# Patient Record
Sex: Male | Born: 1985 | Race: Black or African American | Hispanic: No | Smoking: Current every day smoker
Health system: Southern US, Community
[De-identification: ages and names within clinical notes are randomized; demographics above are authoritative.]

## PROBLEM LIST (undated history)

## (undated) DIAGNOSIS — F329 Major depressive disorder, single episode, unspecified: Secondary | ICD-10-CM

## (undated) DIAGNOSIS — L309 Dermatitis, unspecified: Secondary | ICD-10-CM

## (undated) DIAGNOSIS — I10 Essential (primary) hypertension: Secondary | ICD-10-CM

## (undated) DIAGNOSIS — F32A Depression, unspecified: Secondary | ICD-10-CM

## (undated) DIAGNOSIS — F319 Bipolar disorder, unspecified: Secondary | ICD-10-CM

---
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
## 2005-11-21 ENCOUNTER — Emergency Department (HOSPITAL_COMMUNITY): Admission: EM | Admit: 2005-11-21 | Discharge: 2005-11-21 | Payer: Self-pay | Admitting: Emergency Medicine

## 2006-02-18 ENCOUNTER — Emergency Department (HOSPITAL_COMMUNITY): Admission: EM | Admit: 2006-02-18 | Discharge: 2006-02-18 | Payer: Self-pay | Admitting: Emergency Medicine

## 2006-04-18 ENCOUNTER — Emergency Department (HOSPITAL_COMMUNITY): Admission: EM | Admit: 2006-04-18 | Discharge: 2006-04-18 | Payer: Self-pay | Admitting: Family Medicine

## 2006-05-28 ENCOUNTER — Emergency Department (HOSPITAL_COMMUNITY): Admission: EM | Admit: 2006-05-28 | Discharge: 2006-05-28 | Payer: Self-pay | Admitting: Emergency Medicine

## 2008-07-23 ENCOUNTER — Emergency Department (HOSPITAL_COMMUNITY): Admission: EM | Admit: 2008-07-23 | Discharge: 2008-07-23 | Payer: Self-pay | Admitting: Emergency Medicine

## 2008-08-15 ENCOUNTER — Emergency Department (HOSPITAL_COMMUNITY): Admission: EM | Admit: 2008-08-15 | Discharge: 2008-08-15 | Payer: Self-pay | Admitting: Emergency Medicine

## 2009-02-02 ENCOUNTER — Emergency Department (HOSPITAL_COMMUNITY): Admission: EM | Admit: 2009-02-02 | Discharge: 2009-02-02 | Payer: Self-pay | Admitting: Emergency Medicine

## 2009-11-06 ENCOUNTER — Emergency Department (HOSPITAL_COMMUNITY): Admission: EM | Admit: 2009-11-06 | Discharge: 2009-11-06 | Payer: Self-pay | Admitting: Emergency Medicine

## 2010-06-02 ENCOUNTER — Emergency Department (HOSPITAL_COMMUNITY): Admission: EM | Admit: 2010-06-02 | Discharge: 2010-06-02 | Payer: Self-pay | Admitting: Emergency Medicine

## 2010-06-15 ENCOUNTER — Emergency Department (HOSPITAL_COMMUNITY): Admission: EM | Admit: 2010-06-15 | Discharge: 2010-06-15 | Payer: Self-pay | Admitting: Emergency Medicine

## 2010-07-02 DEATH — deceased

## 2010-07-12 ENCOUNTER — Emergency Department (HOSPITAL_COMMUNITY): Admission: EM | Admit: 2010-07-12 | Discharge: 2010-07-12 | Payer: Self-pay | Admitting: Emergency Medicine

## 2011-03-03 ENCOUNTER — Emergency Department (HOSPITAL_COMMUNITY)
Admission: EM | Admit: 2011-03-03 | Discharge: 2011-03-03 | Disposition: A | Discharge status: Home / Self Care | Payer: Self-pay | Attending: Emergency Medicine | Admitting: Emergency Medicine

## 2011-03-03 DIAGNOSIS — L259 Unspecified contact dermatitis, unspecified cause: Secondary | ICD-10-CM | POA: Insufficient documentation

## 2011-05-10 ENCOUNTER — Inpatient Hospital Stay (INDEPENDENT_AMBULATORY_CARE_PROVIDER_SITE_OTHER)
Admission: RE | Admit: 2011-05-10 | Discharge: 2011-05-10 | Disposition: A | Discharge status: Home / Self Care | Payer: Self-pay | Source: Ambulatory Visit | Attending: Family Medicine | Admitting: Family Medicine

## 2011-05-10 DIAGNOSIS — L259 Unspecified contact dermatitis, unspecified cause: Secondary | ICD-10-CM

## 2011-06-28 ENCOUNTER — Inpatient Hospital Stay (INDEPENDENT_AMBULATORY_CARE_PROVIDER_SITE_OTHER)
Admission: RE | Admit: 2011-06-28 | Discharge: 2011-06-28 | Disposition: A | Discharge status: Home / Self Care | Payer: Self-pay | Source: Ambulatory Visit | Attending: Family Medicine | Admitting: Family Medicine

## 2011-06-28 DIAGNOSIS — L989 Disorder of the skin and subcutaneous tissue, unspecified: Secondary | ICD-10-CM

## 2011-06-28 DIAGNOSIS — L259 Unspecified contact dermatitis, unspecified cause: Secondary | ICD-10-CM

## 2011-07-13 ENCOUNTER — Inpatient Hospital Stay (INDEPENDENT_AMBULATORY_CARE_PROVIDER_SITE_OTHER)
Admission: RE | Admit: 2011-07-13 | Discharge: 2011-07-13 | Disposition: A | Discharge status: Home / Self Care | Payer: Self-pay | Source: Ambulatory Visit | Attending: Family Medicine | Admitting: Family Medicine

## 2011-07-13 DIAGNOSIS — L259 Unspecified contact dermatitis, unspecified cause: Secondary | ICD-10-CM

## 2011-09-01 ENCOUNTER — Inpatient Hospital Stay (INDEPENDENT_AMBULATORY_CARE_PROVIDER_SITE_OTHER)
Admission: RE | Admit: 2011-09-01 | Discharge: 2011-09-01 | Disposition: A | Discharge status: Home / Self Care | Payer: Self-pay | Source: Ambulatory Visit | Attending: Emergency Medicine | Admitting: Emergency Medicine

## 2011-09-01 DIAGNOSIS — L259 Unspecified contact dermatitis, unspecified cause: Secondary | ICD-10-CM

## 2011-09-26 ENCOUNTER — Emergency Department (HOSPITAL_COMMUNITY)
Admission: EM | Admit: 2011-09-26 | Discharge: 2011-09-26 | Disposition: A | Discharge status: Home / Self Care | Payer: Self-pay | Attending: Emergency Medicine | Admitting: Emergency Medicine

## 2011-09-26 DIAGNOSIS — L259 Unspecified contact dermatitis, unspecified cause: Secondary | ICD-10-CM | POA: Insufficient documentation

## 2011-09-26 DIAGNOSIS — R45851 Suicidal ideations: Secondary | ICD-10-CM | POA: Insufficient documentation

## 2011-09-26 DIAGNOSIS — F329 Major depressive disorder, single episode, unspecified: Secondary | ICD-10-CM | POA: Insufficient documentation

## 2011-09-26 DIAGNOSIS — L299 Pruritus, unspecified: Secondary | ICD-10-CM | POA: Insufficient documentation

## 2011-09-26 DIAGNOSIS — F3289 Other specified depressive episodes: Secondary | ICD-10-CM | POA: Insufficient documentation

## 2011-09-26 DIAGNOSIS — R5381 Other malaise: Secondary | ICD-10-CM | POA: Insufficient documentation

## 2011-09-26 DIAGNOSIS — M2550 Pain in unspecified joint: Secondary | ICD-10-CM | POA: Insufficient documentation

## 2011-09-26 DIAGNOSIS — R5383 Other fatigue: Secondary | ICD-10-CM | POA: Insufficient documentation

## 2011-09-26 DIAGNOSIS — M2540 Effusion, unspecified joint: Secondary | ICD-10-CM | POA: Insufficient documentation

## 2011-09-26 LAB — DIFFERENTIAL
Basophils Absolute: 0.1 10*3/uL (ref 0.0–0.1)
Basophils Relative: 1 % (ref 0–1)
Eosinophils Absolute: 0.9 10*3/uL — ABNORMAL HIGH (ref 0.0–0.7)
Eosinophils Relative: 12 % — ABNORMAL HIGH (ref 0–5)
Lymphocytes Relative: 15 % (ref 12–46)
Lymphs Abs: 1.2 10*3/uL (ref 0.7–4.0)
Monocytes Absolute: 0.7 10*3/uL (ref 0.1–1.0)
Monocytes Relative: 9 % (ref 3–12)
Neutro Abs: 5.1 10*3/uL (ref 1.7–7.7)
Neutrophils Relative %: 63 % (ref 43–77)

## 2011-09-26 LAB — BASIC METABOLIC PANEL
BUN: 12 mg/dL (ref 6–23)
CO2: 27 mEq/L (ref 19–32)
Calcium: 8.7 mg/dL (ref 8.4–10.5)
Chloride: 100 mEq/L (ref 96–112)
Creatinine, Ser: 0.97 mg/dL (ref 0.50–1.35)
GFR calc Af Amer: >90 mL/min (ref >90)
GFR calc non Af Amer: >90 mL/min (ref >90)
Glucose, Bld: 107 mg/dL — ABNORMAL HIGH (ref 70–99)
Potassium: 3.8 mEq/L (ref 3.5–5.1)
Sodium: 137 mEq/L (ref 135–145)

## 2011-09-26 LAB — CBC
HCT: 44.2 % (ref 39.0–52.0)
Hemoglobin: 15.7 g/dL (ref 13.0–17.0)
MCH: 31.8 pg (ref 26.0–34.0)
MCHC: 35.5 g/dL (ref 30.0–36.0)
MCV: 89.7 fL (ref 78.0–100.0)
Platelets: 239 10*3/uL (ref 150–400)
RBC: 4.93 MIL/uL (ref 4.22–5.81)
RDW: 12.7 % (ref 11.5–15.5)
WBC: 8 10*3/uL (ref 4.0–10.5)

## 2011-09-26 LAB — ETHANOL: Alcohol, Ethyl (B): <11 mg/dL (ref 0–11)

## 2011-09-26 LAB — RAPID URINE DRUG SCREEN, HOSP PERFORMED
Amphetamines: NOT DETECTED
Barbiturates: NOT DETECTED
Benzodiazepines: NOT DETECTED
Cocaine: NOT DETECTED
Opiates: NOT DETECTED
Tetrahydrocannabinol: POSITIVE — AB

## 2011-09-30 ENCOUNTER — Encounter: Payer: Self-pay | Admitting: Internal Medicine

## 2011-09-30 NOTE — Progress Notes (Signed)
HPI: 25 y/o man presents with PMH significant for eczema presents to the [facility] ER for rash. His rash was consistent with eczema and he states that he noticed the flare up 2 days prior to his ER visit. He usually takes steroids and anti- histamine when he has these flare ups but he ran out of his medications and was unable to take any. He also reports some pain associated with it. He laso reports joint pains and swelling. Denies any fever, chills, nausea or vomiting.  He had seen a dermatologist in the past but that did not help.he does not have any insurance , so getting to a dermatologist would be an issue at this point.  He works at [employer] and states that he might get some insurance in November . He was requesting if he can be seen by a dermatologist.  Vitals: T- 98.8, BP-136/83, HR-11. RR-18, O2 sats-100% on RA.  Labs: reviewed.  Meds: reviewed.  Disposition: Patient was discharged from the Er with a prescription for steroids and anti- histamines. He was given our clinic number and was advised to call our clinic fro an appointment. I will discuss her case in our clinic. But , I anticipate getting a dermatology referral from the clinic would be difficult.

## 2011-10-30 ENCOUNTER — Inpatient Hospital Stay (HOSPITAL_COMMUNITY)
Admission: EM | Admit: 2011-10-30 | Discharge: 2011-10-31 | DRG: 607 | Disposition: A | Discharge status: Home / Self Care | Payer: Self-pay | Attending: Internal Medicine | Admitting: Internal Medicine

## 2011-10-30 DIAGNOSIS — L309 Dermatitis, unspecified: Secondary | ICD-10-CM

## 2011-10-30 DIAGNOSIS — H60399 Other infective otitis externa, unspecified ear: Secondary | ICD-10-CM | POA: Diagnosis present

## 2011-10-30 DIAGNOSIS — L209 Atopic dermatitis, unspecified: Secondary | ICD-10-CM | POA: Diagnosis present

## 2011-10-30 DIAGNOSIS — L0390 Cellulitis, unspecified: Secondary | ICD-10-CM

## 2011-10-30 DIAGNOSIS — F17200 Nicotine dependence, unspecified, uncomplicated: Secondary | ICD-10-CM | POA: Diagnosis present

## 2011-10-30 DIAGNOSIS — R45851 Suicidal ideations: Secondary | ICD-10-CM

## 2011-10-30 DIAGNOSIS — L2089 Other atopic dermatitis: Principal | ICD-10-CM | POA: Diagnosis present

## 2011-10-30 LAB — CBC
HCT: 39.9 % (ref 39.0–52.0)
Hemoglobin: 13.4 g/dL (ref 13.0–17.0)
Hemoglobin: 13.4 g/dL (ref 13.0–17.0)
MCH: 30.6 pg (ref 26.0–34.0)
MCH: 31.2 pg (ref 26.0–34.0)
MCHC: 33.6 g/dL (ref 30.0–36.0)
MCV: 91.1 fL (ref 78.0–100.0)
MCV: 91.2 fL (ref 78.0–100.0)
Platelets: 292 10*3/uL (ref 150–400)
RBC: 4.38 MIL/uL (ref 4.22–5.81)
RBC: 4.3 MIL/uL (ref 4.22–5.81)
RDW: 12.8 % (ref 11.5–15.5)
WBC: 7.8 10*3/uL (ref 4.0–10.5)

## 2011-10-30 LAB — BASIC METABOLIC PANEL
BUN: 9 mg/dL (ref 6–23)
CO2: 25 mEq/L (ref 19–32)
Calcium: 8.5 mg/dL (ref 8.4–10.5)
Chloride: 106 mEq/L (ref 96–112)
Creatinine, Ser: 0.82 mg/dL (ref 0.50–1.35)
GFR calc Af Amer: >90 mL/min (ref >90)
GFR calc non Af Amer: >90 mL/min (ref >90)
Glucose, Bld: 97 mg/dL (ref 70–99)
Potassium: 3.8 mEq/L (ref 3.5–5.1)
Sodium: 139 mEq/L (ref 135–145)

## 2011-10-30 LAB — CREATININE, SERUM
Creatinine, Ser: 0.91 mg/dL (ref 0.50–1.35)
GFR calc Af Amer: >90 mL/min (ref >90)

## 2011-10-30 LAB — ETHANOL: Alcohol, Ethyl (B): <11 mg/dL (ref 0–11)

## 2011-10-30 MED ORDER — LORAZEPAM 1 MG PO TABS: 1.0000 mg | ORAL_TABLET | Freq: Three times a day (TID) | ORAL | Status: DC | PRN

## 2011-10-30 MED ORDER — SODIUM CHLORIDE 0.9 % IV SOLN: 250.0000 mL | INTRAVENOUS | Status: DC | PRN

## 2011-10-30 MED ORDER — ACETAMINOPHEN 325 MG PO TABS: 650.0000 mg | ORAL_TABLET | ORAL | Status: DC | PRN

## 2011-10-30 MED ORDER — HYDROXYZINE HCL 25 MG PO TABS
25.0000 mg | ORAL_TABLET | Freq: Three times a day (TID) | ORAL | Status: DC | PRN
  Filled 2011-10-30: qty 1

## 2011-10-30 MED ORDER — ONDANSETRON HCL 4 MG PO TABS
4.0000 mg | ORAL_TABLET | Freq: Three times a day (TID) | ORAL | Status: DC | PRN
  Administered 2011-10-30: 4 mg via ORAL
  Filled 2011-10-30: qty 1

## 2011-10-30 MED ORDER — SODIUM CHLORIDE 0.9 % IJ SOLN
3.0000 mL | Freq: Two times a day (BID) | INTRAMUSCULAR | Status: DC
  Administered 2011-10-30: 3 mL via INTRAVENOUS

## 2011-10-30 MED ORDER — PREDNISONE 20 MG PO TABS
60.0000 mg | ORAL_TABLET | Freq: Once | ORAL | Status: AC
  Administered 2011-10-30: 60 mg via ORAL
  Filled 2011-10-30: qty 3

## 2011-10-30 MED ORDER — OXYCODONE-ACETAMINOPHEN 5-325 MG PO TABS
1.0000 | ORAL_TABLET | Freq: Once | ORAL | Status: AC
  Administered 2011-10-30: 1 via ORAL
  Filled 2011-10-30: qty 1

## 2011-10-30 MED ORDER — DOXYCYCLINE HYCLATE 100 MG PO TABS
100.0000 mg | ORAL_TABLET | Freq: Once | ORAL | Status: AC
  Administered 2011-10-30: 100 mg via ORAL
  Filled 2011-10-30: qty 1

## 2011-10-30 MED ORDER — IBUPROFEN 600 MG PO TABS
600.0000 mg | ORAL_TABLET | Freq: Three times a day (TID) | ORAL | Status: DC | PRN
  Administered 2011-10-30: 600 mg via ORAL
  Filled 2011-10-30: qty 3
  Filled 2011-10-30: qty 1

## 2011-10-30 MED ORDER — ACETAMINOPHEN 650 MG RE SUPP
650.0000 mg | Freq: Four times a day (QID) | RECTAL | Status: DC | PRN
  Filled 2011-10-30: qty 1

## 2011-10-30 MED ORDER — DOXEPIN HCL 25 MG PO CAPS
25.0000 mg | ORAL_CAPSULE | Freq: Every day | ORAL | Status: DC
  Administered 2011-10-30: 25 mg via ORAL
  Filled 2011-10-30 (×2): qty 1

## 2011-10-30 MED ORDER — TRIAMCINOLONE ACETONIDE 0.1 % EX OINT
TOPICAL_OINTMENT | Freq: Two times a day (BID) | CUTANEOUS | Status: DC
  Administered 2011-10-30 – 2011-10-31 (×2): via TOPICAL
  Filled 2011-10-30: qty 15

## 2011-10-30 MED ORDER — KETOROLAC TROMETHAMINE 60 MG/2ML IM SOLN
60.0000 mg | Freq: Once | INTRAMUSCULAR | Status: DC
  Filled 2011-10-30: qty 2

## 2011-10-30 MED ORDER — METHYLPREDNISOLONE SODIUM SUCC 125 MG IJ SOLR
80.0000 mg | Freq: Four times a day (QID) | INTRAMUSCULAR | Status: DC
  Administered 2011-10-30 – 2011-10-31 (×3): 80 mg via INTRAVENOUS
  Filled 2011-10-30 (×6): qty 1.28

## 2011-10-30 MED ORDER — TUBERCULIN PPD 5 UNIT/0.1ML ID SOLN
5.0000 [IU] | Freq: Once | INTRADERMAL | Status: DC
  Filled 2011-10-30 (×2): qty 0.1

## 2011-10-30 MED ORDER — HYDROCODONE-ACETAMINOPHEN 5-325 MG PO TABS
1.0000 | ORAL_TABLET | ORAL | Status: DC | PRN
  Filled 2011-10-30: qty 1

## 2011-10-30 MED ORDER — ALUM & MAG HYDROXIDE-SIMETH 200-200-20 MG/5ML PO SUSP: 30.0000 mL | ORAL | Status: DC | PRN

## 2011-10-30 MED ORDER — DOXYCYCLINE HYCLATE 100 MG PO TABS
100.0000 mg | ORAL_TABLET | ORAL | Status: AC
  Administered 2011-10-30: 100 mg via ORAL
  Filled 2011-10-30: qty 1

## 2011-10-30 MED ORDER — ENOXAPARIN SODIUM 40 MG/0.4ML ~~LOC~~ SOLN
40.0000 mg | SUBCUTANEOUS | Status: DC
  Administered 2011-10-30: 40 mg via SUBCUTANEOUS
  Filled 2011-10-30 (×2): qty 0.4

## 2011-10-30 MED ORDER — ZOLPIDEM TARTRATE 5 MG PO TABS: 5.0000 mg | ORAL_TABLET | Freq: Every evening | ORAL | Status: DC | PRN

## 2011-10-30 MED ORDER — NICOTINE 21 MG/24HR TD PT24
21.0000 mg | MEDICATED_PATCH | Freq: Every day | TRANSDERMAL | Status: DC
  Administered 2011-10-31: 21 mg via TRANSDERMAL
  Filled 2011-10-30: qty 1

## 2011-10-30 MED ORDER — PREDNISONE 20 MG PO TABS: 60.0000 mg | ORAL_TABLET | Freq: Once | ORAL | Status: DC

## 2011-10-30 MED ORDER — SODIUM CHLORIDE 0.9 % IJ SOLN
3.0000 mL | INTRAMUSCULAR | Status: DC | PRN
  Administered 2011-10-30: 3 mL via INTRAVENOUS

## 2011-10-30 MED ORDER — DOXYCYCLINE HYCLATE 100 MG PO TABS
100.0000 mg | ORAL_TABLET | Freq: Two times a day (BID) | ORAL | Status: DC
  Administered 2011-10-30 – 2011-10-31 (×2): 100 mg via ORAL
  Filled 2011-10-30 (×3): qty 1

## 2011-10-30 MED ORDER — ACETAMINOPHEN 325 MG PO TABS: 650.0000 mg | ORAL_TABLET | Freq: Four times a day (QID) | ORAL | Status: DC | PRN

## 2011-10-30 NOTE — ED Notes (Signed)
Pt. Developed pain all over his skin

## 2011-10-30 NOTE — ED Notes (Signed)
Dr. Fredricka Bonine at bedside assessing pt.

## 2011-10-30 NOTE — ED Notes (Signed)
Pt here with c/o skin rash all over his body.  Pt reports that he has been suffering from severe eczema since 1996.  Pt currently has no insurance until January and has been unable to follow up with dermatology.  Pts rash is all over his body, ears, bue, back, neck, ble as well.  Pt reports that it does drain and ooze and is very debilitating which lends to his depression and suicidal ideations.  Pt has no plan and reports that his thoughts are daily and have been ongoing for years.

## 2011-10-30 NOTE — ED Provider Notes (Signed)
5:05 PM I was called to the bedside to see the patient at the request of his nurse and the case manager, to evaluate him for his severe eczema that is diffuse and over his entire body and severe, causing him pain, and itching. The patient is not actively suicidal and has been cleared by the behavioral health act team for outpatient management of depression. The case manager was concerned about the patient's ability to followup with dermatology for definitive treatment of his eczema and what appears to be some cellulitis at his right ear, with weeping and oozing indurated skin. No other localized sign of infection is present. The patient is awake and uncomfortable appearing itching himself. The patient is in no apparent respiratory distress and has a normal mental status. He is afebrile. I have called and spoken with the hospitalist to evaluate the patient to see if there is a reasonable way that the patient may be admitted for care of what is likely eczema or psoriasis while social work is working on arranging for a sure it outpatient care. He has agreed to see and evaluate the patient.  Felisa Bonier, MD 10/30/11 [phone]

## 2011-10-30 NOTE — ED Notes (Signed)
Report called to Lordis RN on floor and pt moved.  Sitter released.

## 2011-10-30 NOTE — H&P (Signed)
PATIENT DETAILS Name: Julian Pugh Age: 25 y.o. Sex: male Date of Birth: [birth year] Admit Date: 10/30/2011 PCP:No primary provider on file.   CHIEF COMPLAINT:  Itching and pain  HPI: 25 year old male with a history of atopic dermatitis for the last 15 years presenting to the emergency department with severe itching and pain all over his skin. In addition the patient has noticed oozing of clear yellow liquid from his ears and lower back. The patient states that he has visited urgent care centers and these images and department at least 9 or 10 times this year for the same eczema flareups. He feels tired and frustrated. His appointment with dermatology is 11/06/2011 which is his first appointment for ambulatory care. The patient does not have a primary care physician on a regular allergist or dermatologist. He denies fever, chills or night sweats. He denies recent travel or exposure to sick contacts. He states that every time he comes to the ED he gets a tapering dose of prednisone and he is sent home but once the medication is over his eczema flares up back again   ALLERGIES:  No Known Allergies  PAST MEDICAL HISTORY: Atopic dermatitis for the last 15 years Allergic rhinitis, seasonal Allergic conjunctivitis  PAST SURGICAL HISTORY: History reviewed. No pertinent past surgical history.  MEDICATIONS AT HOME: Prior to Admission medications   Not on File    FAMILY HISTORY: Positive family history of allergic rhinitis and eczema  SOCIAL HISTORY: Works as a Production designer, theatre/television/film for [employer] and he is in direct contact to detergents and cleaning products. Admits to smoking half a pack per day and smoking marijuana occasionally. States he had to quit alcohol in order to take prednisone.  REVIEW OF SYSTEMS:  Constitutional:   No  weight loss, night sweats,  Fevers, chills, fatigue.  HEENT:    No headaches, Difficulty swallowing,Tooth/dental problems,Sore throat,  No sneezing, itching, ear  ache, nasal congestion, post nasal drip,   Cardio-vascular: No chest pain,  Orthopnea, PND, swelling in lower extremities, anasarca,         dizziness, palpitations  GI:  No heartburn, indigestion, abdominal pain, nausea, vomiting, diarrhea, change in       bowel habits, loss of appetite  Resp: No shortness of breath with exertion or at rest.  No excess mucus, no productive cough, No non-productive cough,  No coughing up of blood.No change in color of mucus.No wheezing.No chest wall deformity  Skin:  As in history of present illness  GU:  no dysuria, change in color of urine, no urgency or frequency.  No flank pain.  Musculoskeletal: No joint pain or swelling.  No decreased range of motion.  No back pain.  Psych: No change in mood or affect. No depression or anxiety.  No memory loss.   PHYSICAL EXAM: Blood pressure 118/64, pulse 78, temperature 98.1 F (36.7 C), temperature source Oral, resp. rate 18, height 5\' 10"  (1.778 m), weight 135 lb (61.236 kg), SpO2 98.00%.  General appearance :Awake, alert, not in any distress. Speech Clear. Not toxic Looking HEENT: Atraumatic and Normocephalic, pupils equally reactive to light and accomodation Neck: supple, no JVD. No cervical lymphadenopathy.  Chest:Good air entry bilaterally, no added sounds  CVS: S1 S2 regular, no murmurs.  Abdomen: Bowel sounds present, Non tender and not distended with no gaurding, rigidity or rebound. Extremities: B/L Lower Ext shows no edema, both legs are warm to touch, with  dorsalis pedis pulses palpable. Neurology: Awake alert, and oriented X 3, CN II-XII  intact, Non focal, Deep Tendon Reflex-2+ all over, plantar's downgoing B/L, sensory exam is grossly intact.  Skin: severe thickened skin, increased skin markings (lichenification), and excoriated and fibrotic papules over her trunk, back and upper and lower extremities, scalp and neck, ulcerations over his shins , lichenification of the ears with oozing of  serous drainage, excoriation in the lower back with oozing serous drainage as well   LABS ON ADMISSION:   Basename 10/30/11 1153  NA 139  K 3.8  CL 106  CO2 25  GLUCOSE 97  BUN 9  CREATININE 0.82  CALCIUM 8.5  MG --  PHOS --   No results found for this basename: AST:2,ALT:2,ALKPHOS:2,BILITOT:2,PROT:2,ALBUMIN:2 in the last 72 hours No results found for this basename: LIPASE:2,AMYLASE:2 in the last 72 hours  Basename 10/30/11 1153  WBC 7.8  NEUTROABS --  HGB 13.4  HCT 39.9  MCV 91.1  PLT 292   No results found for this basename: CKTOTAL:3,CKMB:3,CKMBINDEX:3,TROPONINI:3 in the last 72 hours No results found for this basename: DDIMER:2 in the last 72 hours No results found for this basename: POCBNP:3 in the last 72 hours   RADIOLOGIC STUDIES ON ADMISSION: No results found.  ASSESSMENT AND PLAN: Present on Admission:  .Atopic eczema, severe, complicated by secondary infection.  admit to MedSurg unit Start IV Solu-Medrol 80 mg every 6 hours Triamcinolone ointment 0.1% apply twice a day to affected areas excluding face and skin folds Tazorac to face and skin folds Hydroxyzine 25 mg every 6 when necessary for itching Doxepin 25 mg by mouth each bedtime Obtained MRSA screening Doxycycline 100 mg by mouth twice a day Check RPR and place PPD Monitor CBG and contact M.D. if glucose greater than 140 for insulin sliding scale Limit exposure to heat, water and humidity Get plenty of sleep No need for moisturizing creams or lubricants if patient is treated with triamcinolone ointment base  Dermatology consultation for consideration of phototherapy or cyclosporin or methotrexate.     Further plan will depend as patient's clinical course evolves and further radiologic and laboratory data become available. Patient will be monitored closely.   DVT Prophylaxis: Lovenox   Code Status:Full code   Total time spent for admission equals 45 minutes.  Jonny Ruiz 10/30/2011, 6:42 PM

## 2011-10-30 NOTE — Progress Notes (Signed)
Met with patient after receiving text from SW [place] to provide assistance to patient. Patient states he has a hard time describing his discomfort that is at about 7/10. Patient is constantly rubbing and scratching his face, head, arms, ears, pacing around the room then sitting down and covering up with the blanket. Sheets are covered in bloody, serosanguinous fluids.

## 2011-10-30 NOTE — Progress Notes (Signed)
Called Dr Jordan Hawks to verify Julian Pugh order, said Okay to DC sitter.

## 2011-10-30 NOTE — ED Notes (Signed)
Anne from Care management at pts bedside doing assessment.

## 2011-10-30 NOTE — ED Provider Notes (Signed)
History     CSN: [id number] Arrival date & time: 10/30/2011 10:59 AM   First MD Initiated Contact with Patient 10/30/11 1142      Chief Complaint  Patient presents with  . Rash  . Suicidal    (Consider location/radiation/quality/duration/timing/severity/associated sxs/prior treatment) HPI Patient presents with complaint of diffuse generalized rash that he's had more than 10 years. He states he has chronic pain from this rash and that he has difficulty working due to this pain in the rash. He has no systemic symptoms. He has not been able to see a dermatologist do to financial issues. He also states that he feels suicidal. He states that this is a constant problem for him and at night he feels that his problems are overwhelming. He denies any substance use. He denies any history of suicide attempts.  History reviewed. No pertinent past medical history.  History reviewed. No pertinent past surgical history.  History reviewed. No pertinent family history.  History  Substance Use Topics  . Smoking status: Current Everyday Smoker -- 1.0 packs/day    Types: Cigarettes  . Smokeless tobacco: Not on file  . Alcohol Use: Yes      Review of Systems ROS reviewed and otherwise negative except for mentioned in HPI  Allergies  Review of patient's allergies indicates no known allergies.  Home Medications  No current outpatient prescriptions on file.  BP 118/64  Pulse 78  Temp(Src) 98.1 F (36.7 C) (Oral)  Resp 18  Ht 5\' 10"  (1.778 m)  Wt 135 lb (61.236 kg)  BMI 19.37 kg/m2  SpO2 98% Vitals reviewed Physical Exam Physical Examination: General appearance - alert, well appearing, and in no distress Mental status - alert, oriented to person, place, and time, flat affect Chest - clear to auscultation, no wheezes, rales or rhonchi, symmetric air entry Heart - normal rate, regular rhythm, normal S1, S2, no murmurs, rubs, clicks or gallops Musculoskeletal - no joint tenderness,  deformity or swelling Extremities - peripheral pulses normal, no pedal edema, no clubbing or cyanosis Skin - diffuse dry skin over exremities, palms, ears with cracking and excoriation Mouth- MMM, no OP lesions  ED Course  Procedures (including critical care time)   Labs Reviewed  CBC  BASIC METABOLIC PANEL  ETHANOL  URINE RAPID DRUG SCREEN (HOSP PERFORMED)   No results found.   1. Eczema   2. Cellulitis     3:03 PM pt seen by ACT team- she states he is not actively suicidal- has new baby and has no active plan or intentions, but does feel depressed about his current situation.  She has contacted social work who is discussing with case management to see if there is a way to assist him with getting in to see dermatology- he has appointment 12/5  MDM  Patient presenting with concern for his chronic and diffuse eczema causing him pain. He also states that he feels suicidal. Medical clearance workup was begun. He was given Toradol for the discomfort from the skin rash. He will be assessed by the act team do to his depression and suicidality.        Ethelda Chick, MD 10/30/11 [phone]

## 2011-10-30 NOTE — BH Assessment (Signed)
Assessment Note   Julian Pugh is an 25 y.o. male that presented to the ED to address pain/itching a/w his chronic Cirrhosis, Eczema, and hip dysplasia.  Pt is not eligible for insurance until January and has been dealing with his medical problems since a child, worsening as of late.  Pt does admit thoughts of suicide a/w his medical problems, but denies plan or intent "I just want this to go away.  I want help with my conditions."  Pt also believes that treatment with Prednisone also worsened his depressive symptoms.  Pt is interested in receiving a consult from Care Management/SW to address possible f/u with medical providers to address his current conditions.  Pt denies HI, psychosis or any substance abuse concerns.  Pt is able to contract for safety and accepted f/u psychiatric referrals prn.  Writer spoke with Dr. Karma Ganja who is agreeable with the referrals and f/u proposed.    Axis I: Depressive Disorder secondary to general medical condition Axis II: Deferred Axis III: History reviewed. No pertinent past medical history. Axis IV: economic problems, problems with access to health care services and problems with primary support group Axis V: 41-50 serious symptoms  Past Medical History: History reviewed. No pertinent past medical history.  History reviewed. No pertinent past surgical history.  Family History: History reviewed. No pertinent family history.  Social History:  reports that he has been smoking Cigarettes.  He has been smoking about 1 pack per day. He does not have any smokeless tobacco history on file. He reports that he drinks alcohol. He reports that he does not use illicit drugs.  Allergies: No Known Allergies  Home Medications:  Medications Prior to Admission  Medication Dose Route Frequency Provider Last Rate Last Dose  . acetaminophen (TYLENOL) tablet 650 mg  650 mg Oral Q4H PRN Ethelda Chick, MD      . alum & mag hydroxide-simeth (MAALOX/MYLANTA) 200-200-20  MG/5ML suspension 30 mL  30 mL Oral PRN Ethelda Chick, MD      . ibuprofen (ADVIL,MOTRIN) tablet 600 mg  600 mg Oral Q8H PRN Ethelda Chick, MD      . ketorolac (TORADOL) injection 60 mg  60 mg Intramuscular Once Ethelda Chick, MD      . LORazepam (ATIVAN) tablet 1 mg  1 mg Oral Q8H PRN Ethelda Chick, MD      . nicotine (NICODERM CQ - dosed in mg/24 hours) patch 21 mg  21 mg Transdermal Daily Ethelda Chick, MD      . ondansetron [facility]) tablet 4 mg  4 mg Oral Q8H PRN Ethelda Chick, MD      . zolpidem (AMBIEN) tablet 5 mg  5 mg Oral QHS PRN Ethelda Chick, MD       No current outpatient prescriptions on file as of 10/30/2011.    OB/GYN Status:  No LMP for male patient.  General Assessment Data Assessment Number: 1  Living Arrangements: Other (Comment) Can pt return to current living arrangement?: Yes Admission Status: Voluntary Is patient capable of signing voluntary admission?: Yes Transfer from: Acute Hospital Referral Source: MD  Risk to self Suicidal Ideation: Yes-Currently Present Suicidal Intent: No Is patient at risk for suicide?: No Suicidal Plan?: No-Not Currently/Within Last 6 Months Access to Means: Yes Specify Access to Suicidal Means:  (pills and knives available) What has been your use of drugs/alcohol within the last 12 months?: unknown Other Self Harm Risks: none Triggers for Past Attempts: Unknown Intentional  Self Injurious Behavior: None Factors that decrease suicide risk: Sense of responsibility to family Family Suicide History: No Recent stressful life event(s): Recent negative physical changes Persecutory voices/beliefs?: No Depression: Yes Depression Symptoms: Insomnia;Guilt;Loss of interest in usual pleasures;Feeling worthless/self pity;Feeling angry/irritable Substance abuse history and/or treatment for substance abuse?: No Suicide prevention information given to non-admitted patients: Yes  Risk to Others Homicidal Ideation: No Thoughts  of Harm to Others: No Current Homicidal Intent: No Current Homicidal Plan: No Access to Homicidal Means: No Identified Victim: n/a History of harm to others?: No Assessment of Violence: None Noted Violent Behavior Description: none noted Does patient have access to weapons?: Yes (Comment) Criminal Charges Pending?: No Does patient have a court date: No  Mental Status Report Appear/Hygiene: Disheveled Eye Contact: Fair Motor Activity: Restlessness Speech: Soft Level of Consciousness: Quiet/awake Mood: Depressed;Anxious Affect: Anxious;Apathetic;Depressed Anxiety Level: Moderate Thought Processes: Relevant Judgement: Impaired Orientation: Person;Place;Time;Situation Obsessive Compulsive Thoughts/Behaviors: Severe  Cognitive Functioning Concentration: Normal Memory: Recent Intact;Remote Intact IQ: Average Insight: Fair Impulse Control: Fair Appetite: Fair Weight Loss: 0  Weight Gain: 0  Sleep: Decreased Total Hours of Sleep: 4  Vegetative Symptoms: None               Values / Beliefs Cultural Requests During Hospitalization: None Spiritual Requests During Hospitalization: None        Additional Information 1:1 In Past 12 Months?: No CIRT Risk: No Elopement Risk: No Does patient have medical clearance?: Yes     Disposition:  Disposition Disposition of Patient: Referred to Patient referred to: Other (Comment) (referred to Care Management and SW)  On Site Evaluation by:   Reviewed with Physician:     Angelica Ran 10/30/2011 2:42 PM

## 2011-10-31 LAB — RAPID URINE DRUG SCREEN, HOSP PERFORMED
Amphetamines: NOT DETECTED
Barbiturates: NOT DETECTED
Benzodiazepines: NOT DETECTED

## 2011-10-31 LAB — HIV ANTIBODY (ROUTINE TESTING W REFLEX): HIV: NONREACTIVE

## 2011-10-31 LAB — MRSA PCR SCREENING: MRSA by PCR: NEGATIVE

## 2011-10-31 MED ORDER — AVEENO SOOTHING BATH TREATMENT EX PACK
1.0000 | PACK | Freq: Once | CUTANEOUS | Status: AC
  Administered 2011-10-31: 1 via TOPICAL
  Filled 2011-10-31: qty 1

## 2011-10-31 MED ORDER — HYDROXYZINE HCL 25 MG PO TABS: 25.0000 mg | ORAL_TABLET | Freq: Three times a day (TID) | ORAL | Status: AC | PRN

## 2011-10-31 MED ORDER — TRIAMCINOLONE ACETONIDE 0.5 % EX CREA: TOPICAL_CREAM | Freq: Two times a day (BID) | CUTANEOUS | Status: AC

## 2011-10-31 MED ORDER — HYDROCODONE-ACETAMINOPHEN 5-325 MG PO TABS: 1.0000 | ORAL_TABLET | ORAL | Status: AC | PRN

## 2011-10-31 MED ORDER — TRIAMCINOLONE ACETONIDE 0.5 % EX CREA
1.0000 "application " | TOPICAL_CREAM | Freq: Two times a day (BID) | CUTANEOUS | Status: DC
  Filled 2011-10-31: qty 15

## 2011-10-31 MED ORDER — TRIAMCINOLONE ACETONIDE 0.1 % EX OINT: TOPICAL_OINTMENT | Freq: Two times a day (BID) | CUTANEOUS | Status: DC

## 2011-10-31 NOTE — Progress Notes (Signed)
   CARE MANAGEMENT NOTE 10/31/2011  Patient:  Julian Pugh, Julian Pugh   Account Number:  [id number]  Date Initiated:  10/31/2011  Documentation initiated by:  Donn Pierini  Subjective/Objective Assessment:   Pt admitted with severe eczema     Action/Plan:   PTA pt lived at rooming house in [place], was independent with ADLs   Anticipated DC Date:  11/01/2011   Anticipated DC Plan:  HOME/SELF CARE      DC Planning Services  CM consult  Medication Assistance      Choice offered to / List presented to:             Status of service:  Completed, signed off Medicare Important Message given?   (If response is "NO", the following Medicare IM given date fields will be blank) Date Medicare IM given:   Date Additional Medicare IM given:    Discharge Disposition:  HOME/SELF CARE  Per UR Regulation:  Reviewed for med. necessity/level of care/duration of stay  Comments:  10/31/11- 1130- Donn Pierini RN, BSN [phone] Spoke with pt at bedside, per conversation pt states that he actually lives in a rooming house here in [place], uses the [facility] on [address] for any medications needed. CM to follow for any potential d/c needs. Checked with Pharmacy- pt is eligible for assistance with medications if needed at discharge. update- 1645- pt for discharge- to assist with medication- through indigent fund- for ointment

## 2011-10-31 NOTE — Progress Notes (Signed)
Utilization review complete 

## 2011-10-31 NOTE — Consult Note (Signed)
Wound care note:  Severe eczema is beyond WOC scope of practice.  PLEASE CONSULT DERMATOLOGY for assessment and plan of care for this complex problem.   Will not plan to follow further unless re-consulted.  Mardee Postin, RN, MSN, [employer]  [phone]

## 2011-10-31 NOTE — Discharge Summary (Signed)
Physician Discharge Summary  Patient ID: Julian Pugh MRN: [id number] DOB/AGE: Jun 27, 1986 25 y.o. Primary Care Physician:No primary provider on file. Admit date: 10/30/2011 Discharge date: 10/31/2011    Discharge Diagnoses:   Principal Problem:  *Atopic eczema   Current Discharge Medication List    START taking these medications   Details  HYDROcodone-acetaminophen (NORCO) 5-325 MG per tablet Take 1-2 tablets by mouth every 4 (four) hours as needed for pain. Qty: 30 tablet, Refills: 0    hydrOXYzine (ATARAX/VISTARIL) 25 MG tablet Take 1 tablet (25 mg total) by mouth 3 (three) times daily as needed for itching. Qty: 30 tablet, Refills: 0    triamcinolone cream (KENALOG) 0.5 % Apply topically 2 (two) times daily. Qty: 450 g, Refills: 0        Discharged Condition: fair    Consults:none  Significant Diagnostic Studies: No results found.  Lab Results: Basic Metabolic Panel:  Basename 10/30/11 2134 10/30/11 1153  NA -- 139  K -- 3.8  CL -- 106  CO2 -- 25  GLUCOSE -- 97  BUN -- 9  CREATININE 0.91 0.82  CALCIUM -- 8.5  MG -- --  PHOS -- --   Liver Function Tests: No results found for this basename: AST:2,ALT:2,ALKPHOS:2,BILITOT:2,PROT:2,ALBUMIN:2 in the last 72 hours   CBC:  Basename 10/30/11 2134 10/30/11 1153  WBC 9.6 7.8  NEUTROABS -- --  HGB 13.4 13.4  HCT 39.2 39.9  MCV 91.2 91.1  PLT 297 292    Recent Results (from the past 240 hour(s))  MRSA PCR SCREENING     Status: Normal   Collection Time   10/30/11 11:06 PM      Component Value Range Status Comment   MRSA by PCR NEGATIVE  NEGATIVE  Final      Hospital Course:  25 yo man with history of eczema admitted from the ED after he presented with widespread lesions and itching and pain to the point of commiting suicide. He received 2 doses of iv steroids and prn anxiolytics with improvement in his overall state. He remains with plaque like lesions over the back, chest, neck, scalp, palms,  feet. Plan of care was discussed with Dr. Karlyn Agee and includes triamcinolone cream and close F/U in the office.    Discharge Exam: Blood pressure 117/61, pulse 100, temperature 98 F (36.7 C), temperature source Oral, resp. rate 18, height 5\' 10"  (1.778 m), weight 61.236 kg (135 lb), SpO2 97.00%. Patient is alert and oriented x3 Lungs are clear to auscultation bilaterally Heart regular without murmurs rubs or gallops Skin with plaque-like scaly lesions on the elbows, neck, palms,   Disposition: home    Follow-up Information    Make an appointment with Wonda Amis, MD.   Contact information:   [address] [facility] [place] [address] [phone]          Signed: Lonia Blood 10/31/2011, 5:12 PM

## 2013-11-29 ENCOUNTER — Encounter (HOSPITAL_COMMUNITY): Payer: Self-pay | Admitting: Emergency Medicine

## 2013-11-29 ENCOUNTER — Emergency Department (HOSPITAL_COMMUNITY)
Admission: EM | Admit: 2013-11-29 | Discharge: 2013-11-30 | Disposition: A | Discharge status: Home / Self Care | Payer: Self-pay | Attending: Emergency Medicine | Admitting: Emergency Medicine

## 2013-11-29 DIAGNOSIS — K089 Disorder of teeth and supporting structures, unspecified: Secondary | ICD-10-CM | POA: Insufficient documentation

## 2013-11-29 DIAGNOSIS — F17200 Nicotine dependence, unspecified, uncomplicated: Secondary | ICD-10-CM | POA: Insufficient documentation

## 2013-11-29 DIAGNOSIS — K0889 Other specified disorders of teeth and supporting structures: Secondary | ICD-10-CM

## 2013-11-29 DIAGNOSIS — K0381 Cracked tooth: Secondary | ICD-10-CM | POA: Insufficient documentation

## 2013-11-29 DIAGNOSIS — K029 Dental caries, unspecified: Secondary | ICD-10-CM | POA: Insufficient documentation

## 2013-11-29 MED ORDER — ACETAMINOPHEN 325 MG PO TABS
650.0000 mg | ORAL_TABLET | Freq: Once | ORAL | Status: AC
  Administered 2013-11-29: 650 mg via ORAL
  Filled 2013-11-29: qty 2

## 2013-11-29 NOTE — ED Notes (Signed)
Patient presents stating that he has several broken teeth and bad teeth but the one on the top right is really worse.  "It is surrounded by many broken teeth"

## 2013-11-29 NOTE — ED Notes (Signed)
Unable to locate pt. at triage and waiting room .

## 2013-11-29 NOTE — ED Provider Notes (Signed)
CSN: [id number]     Arrival date & time 11/29/13  2027 History   This chart was scribed for non-physician practitioner Raymon Mutton, PA-C, working with Doug Sou, MD, by Yevette Edwards, ED Scribe. This patient was seen in room TR07C/TR07C and the patient's care was started at 12:00 AM.  First MD Initiated Contact with Patient 11/29/13 2232     Chief Complaint  Patient presents with  . Dental Pain    The history is provided by the patient. No language interpreter was used.   HPI Comments: Julian Pugh is a 27 y.o. male who presents to the Emergency Department complaining of gradually-increasing upper, right-sided dental pain which began this morning. He characterizes the pain as "throbbing," and he reports it has affected his ability to eat normally. He rates the pain as 10/10. He has also experienced minimal drainage from the site . The pt reports he has used Orajel, Ibuprofen, and Tylenol without resolution.  The pt denies trouble swallowing, SOB, neck pain, neck stiffness or a fever. In the ED his temperature is 98.5. He has a h/o dental issues including several broken teeth. He reports that he does not use a dentist, but instead pulls his own teeth. He also reports he brushes his teeth twice a day, but when he attempts to floss, his gums bleed. The pt smokes cigarettes and marijuana. He denies heroine and cocaine usage.   History reviewed. No pertinent past medical history. History reviewed. No pertinent past surgical history. History reviewed. No pertinent family history. History  Substance Use Topics  . Smoking status: Current Every Day Smoker -- 1.00 packs/day    Types: Cigarettes  . Smokeless tobacco: Not on file  . Alcohol Use: Yes    Review of Systems  Constitutional: Negative for fever.  HENT: Positive for dental problem. Negative for trouble swallowing.   Respiratory: Negative for shortness of breath.   Musculoskeletal: Negative for neck pain and neck stiffness.   All other systems reviewed and are negative.   Allergies  Review of patient's allergies indicates no known allergies.  Home Medications   Current Outpatient Rx  Name  Route  Sig  Dispense  Refill  . HYDROcodone-acetaminophen (NORCO/VICODIN) 5-325 MG per tablet   Oral   Take 1 tablet by mouth every 6 (six) hours as needed.   9 tablet   0   . penicillin v potassium (VEETID) 500 MG tablet   Oral   Take 1 tablet (500 mg total) by mouth 4 (four) times daily.   40 tablet   0     Triage Vitals: BP 137/93  Pulse 67  Temp(Src) 98.5 F (36.9 C) (Oral)  Resp 20  Ht 5\' 9"  (1.753 m)  Wt 144 lb (65.318 kg)  BMI 21.26 kg/m2  SpO2 99%  Physical Exam  Nursing note and vitals reviewed. Constitutional: He is oriented to person, place, and time. He appears well-developed and well-nourished. No distress.  HENT:  Head: Normocephalic and atraumatic.  Mouth/Throat: Oropharynx is clear and moist. No oropharyngeal exudate.  Negative facial swelling Discomfort upon palpation to the right maxillary and right mandibular region of the jaw line. Poor dentition noted with numerous teeth in decaying process and diagrammed. Negative periapical abscesses identified. Uvula midline, symmetrical elevation. Negative trismus. Negative sublingual lesions. Negative active drainage or bleeding noted. Negative findings of abscess.   Eyes: EOM are normal.  Neck: Normal range of motion. Neck supple.  Negative neck stiffness Negative nuchal rigidity Negative cervical lymphadenopathy Negative pain  upon palpation to C-spine  Cardiovascular: Normal rate, regular rhythm and normal heart sounds.   Pulses:      Radial pulses are 2+ on the right side, and 2+ on the left side.  Pulmonary/Chest: Effort normal and breath sounds normal. No respiratory distress. He has no wheezes. He has no rales.  Musculoskeletal: Normal range of motion.  Lymphadenopathy:    He has no cervical adenopathy.  Neurological: He is alert  and oriented to person, place, and time. No cranial nerve deficit. He exhibits normal muscle tone. Coordination normal.  Cranial nerves III through XII grossly intact  Skin: Skin is warm and dry.  Psychiatric: He has a normal mood and affect. His behavior is normal.    ED Course  Procedures (including critical care time)  DIAGNOSTIC STUDIES: Oxygen Saturation is 99% on room air, normal by my interpretation.    COORDINATION OF CARE:  12:06 AM- Discussed treatment plan with patient, which includes pain medication, an antibiotic,  and dental referrals,  and the patient agreed to the plan.   Labs Review Labs Reviewed - No data to display Imaging Review No results found.  EKG Interpretation   None       MDM   1. Pain, dental    Medications  acetaminophen (TYLENOL) tablet 650 mg (650 mg Oral Given 11/29/13 2137)  oxyCODONE-acetaminophen (PERCOCET/ROXICET) 5-325 MG per tablet 1 tablet (1 tablet Oral Given 11/30/13 0026)  penicillin v potassium (VEETID) tablet 500 mg (500 mg Oral Given 11/30/13 0026)   Filed Vitals:   11/29/13 2029  BP: 137/93  Pulse: 67  Temp: 98.5 F (36.9 C)  TempSrc: Oral  Resp: 20  Height: 5\' 9"  (1.753 m)  Weight: 144 lb (65.318 kg)  SpO2: 99%   I personally performed the services described in this documentation, which was scribed in my presence. The recorded information has been reviewed and is accurate.  Patient presenting to the ED with dental pain that started this morning described as an aching, throbbing sensation localized to the right upper jawline with radiation to the right side of the face. Reported that the last time he was seen at a dentist was a long time ago. Patient reported that he has been using Ibuprofen, orajel, and other over the counter medications with minimal relief.  Alert and oriented. GCS 15. Heart rate and rhythm normal. Lungs clear to auscultation bilaterally. Pulses palpable and strong, radial 2+. Negative facial swelling  identified. Poor dentition identified-numerous teeth in decaying process, black in color, numerous teeth diagrammed. Negative swelling, active bleeding or drainage noted to the gumline. Discomfort upon palpation to right maxillary jawline. Negative signs of periapical abscess. Negative trismus. Negative uvula swelling. Uvula midline, symmetrical elevation. Negative sublingual lesions. Doubt Ludwig's angina. Doubt peritonsillar abscess. Dental pain secondary to poor dentition and poor dental hygiene. Pain medications and beginnings of antibiotics given in ED setting. Patient stable, afebrile. Discharged patient. Referred patient to dentist. Discussed with patient that the only way for relief of pain to occur is with extraction of teeth. Discharge patient with antibiotics and pain medication-discussed course, precautions, disposal technique. Dental referral given in discharge paperwork. Discussed with patient to closely monitor symptoms and if symptoms are to worsen or change to report back to the ED - strict return instructions given.  Patient agreed to plan of care, understood, all questions answered.     Raymon Mutton, PA-C 11/30/13 1451

## 2013-11-30 MED ORDER — PENICILLIN V POTASSIUM 250 MG PO TABS
500.0000 mg | ORAL_TABLET | Freq: Once | ORAL | Status: AC
  Administered 2013-11-30: 500 mg via ORAL
  Filled 2013-11-30: qty 2

## 2013-11-30 MED ORDER — PENICILLIN V POTASSIUM 500 MG PO TABS: 500.0000 mg | ORAL_TABLET | Freq: Four times a day (QID) | ORAL | Status: AC

## 2013-11-30 MED ORDER — OXYCODONE-ACETAMINOPHEN 5-325 MG PO TABS
1.0000 | ORAL_TABLET | Freq: Once | ORAL | Status: AC
  Administered 2013-11-30: 1 via ORAL
  Filled 2013-11-30: qty 1

## 2013-11-30 MED ORDER — HYDROCODONE-ACETAMINOPHEN 5-325 MG PO TABS: 1.0000 | ORAL_TABLET | Freq: Four times a day (QID) | ORAL | Status: DC | PRN

## 2013-12-01 NOTE — ED Provider Notes (Signed)
Medical screening examination/treatment/procedure(s) were performed by non-physician practitioner and as supervising physician I was immediately available for consultation/collaboration.  EKG Interpretation   None        Doug Sou, MD 12/01/13 1052

## 2014-12-01 ENCOUNTER — Emergency Department (HOSPITAL_COMMUNITY)
Admission: EM | Admit: 2014-12-01 | Discharge: 2014-12-01 | Disposition: A | Discharge status: Home / Self Care | Payer: BC Managed Care – PPO | Attending: Emergency Medicine | Admitting: Emergency Medicine

## 2014-12-01 ENCOUNTER — Encounter (HOSPITAL_COMMUNITY): Payer: Self-pay | Admitting: Emergency Medicine

## 2014-12-01 DIAGNOSIS — Z79899 Other long term (current) drug therapy: Secondary | ICD-10-CM | POA: Insufficient documentation

## 2014-12-01 DIAGNOSIS — N419 Inflammatory disease of prostate, unspecified: Secondary | ICD-10-CM

## 2014-12-01 DIAGNOSIS — Z720 Tobacco use: Secondary | ICD-10-CM | POA: Insufficient documentation

## 2014-12-01 DIAGNOSIS — N410 Acute prostatitis: Secondary | ICD-10-CM | POA: Insufficient documentation

## 2014-12-01 LAB — URINALYSIS, ROUTINE W REFLEX MICROSCOPIC
BILIRUBIN URINE: NEGATIVE
Glucose, UA: NEGATIVE mg/dL
HGB URINE DIPSTICK: NEGATIVE
Ketones, ur: NEGATIVE mg/dL
Leukocytes, UA: NEGATIVE
Nitrite: NEGATIVE
PH: 7.5 (ref 5.0–8.0)
Protein, ur: NEGATIVE mg/dL
SPECIFIC GRAVITY, URINE: 1.016 (ref 1.005–1.030)
UROBILINOGEN UA: 1 mg/dL (ref 0.0–1.0)

## 2014-12-01 MED ORDER — CIPROFLOXACIN HCL 500 MG PO TABS: 500.0000 mg | ORAL_TABLET | Freq: Two times a day (BID) | ORAL | Status: DC

## 2014-12-01 NOTE — ED Provider Notes (Signed)
CSN: [id number]     Arrival date & time 12/01/14  1538 History   First MD Initiated Contact with Patient 12/01/14 1740     Chief Complaint  Patient presents with  . Testicle Pain     (Consider location/radiation/quality/duration/timing/severity/associated sxs/prior Treatment) Patient is a 28 y.o. male presenting with testicular pain. The history is provided by the patient.  Testicle Pain This is a chronic problem. Episode onset: Intermittiant pain for over one year, sometimes left testicle, sometimes right testcile. Current pain started one week ago. The problem occurs intermittently. The problem has been waxing and waning. Pertinent negatives include no abdominal pain, change in bowel habit, chest pain, diaphoresis, fever, headaches, myalgias, nausea, numbness, rash, sore throat, vomiting or weakness. Associated symptoms comments: Dysuria. He has tried nothing for the symptoms.    History reviewed. No pertinent past medical history. History reviewed. No pertinent past surgical history. History reviewed. No pertinent family history. History  Substance Use Topics  . Smoking status: Current Every Day Smoker -- 1.00 packs/day    Types: Cigarettes  . Smokeless tobacco: Not on file  . Alcohol Use: Yes    Review of Systems  Constitutional: Negative for fever, diaphoresis, activity change and appetite change.  HENT: Negative for facial swelling, sore throat, tinnitus, trouble swallowing and voice change.   Eyes: Negative for pain, redness and visual disturbance.  Respiratory: Negative for chest tightness, shortness of breath and wheezing.   Cardiovascular: Negative for chest pain, palpitations and leg swelling.  Gastrointestinal: Negative for nausea, vomiting, abdominal pain, diarrhea, constipation, blood in stool, abdominal distention, anal bleeding, rectal pain and change in bowel habit.  Endocrine: Negative.   Genitourinary: Positive for dysuria and testicular pain. Negative for  decreased urine volume and scrotal swelling.  Musculoskeletal: Negative for myalgias, back pain and gait problem.  Skin: Negative.  Negative for rash.  Neurological: Negative.  Negative for dizziness, tremors, weakness, numbness and headaches.  Psychiatric/Behavioral: Negative for suicidal ideas, hallucinations and self-injury. The patient is not nervous/anxious.       Allergies  Review of patient's allergies indicates no known allergies.  Home Medications   Prior to Admission medications   Medication Sig Start Date End Date Taking? Authorizing Provider  triamcinolone cream (KENALOG) 0.1 % Apply 1 application topically daily as needed (dry skin and eczema).  08/24/14  Yes Historical Provider, MD  ciprofloxacin (CIPRO) 500 MG tablet Take 1 tablet (500 mg total) by mouth every 12 (twelve) hours. 12/01/14   Lula OlszewskiMike Phu Record, MD  HYDROcodone-acetaminophen (NORCO/VICODIN) 5-325 MG per tablet Take 1 tablet by mouth every 6 (six) hours as needed. Patient not taking: Reported on 12/01/2014 11/30/13   [name], PA-C   BP 109/66 mmHg  Pulse 85  Temp(Src) 98.1 F (36.7 C) (Oral)  Resp 18  Ht 5\' 9"  (1.753 m)  Wt 140 lb (63.504 kg)  BMI 20.67 kg/m2  SpO2 100% Physical Exam  Constitutional: He is oriented to person, place, and time. He appears well-developed and well-nourished. No distress.  HENT:  Head: Normocephalic and atraumatic.  Right Ear: External ear normal.  Left Ear: External ear normal.  Nose: Nose normal.  Mouth/Throat: Oropharynx is clear and moist.  Eyes: Conjunctivae and EOM are normal. Pupils are equal, round, and reactive to light. No scleral icterus.  Neck: Normal range of motion. Neck supple. No JVD present. No tracheal deviation present. No thyromegaly present.  Cardiovascular: Normal rate and intact distal pulses.  Exam reveals no gallop and no friction rub.   No  murmur heard. Pulmonary/Chest: Effort normal and breath sounds normal. No stridor. No respiratory  distress. He has no wheezes. He has no rales.  Abdominal: Soft. He exhibits no distension. There is no tenderness. There is no rebound and no guarding. Hernia confirmed negative in the right inguinal area and confirmed negative in the left inguinal area.  Genitourinary: Rectum normal, testes normal and penis normal. Rectal exam shows no external hemorrhoid and no internal hemorrhoid. Prostate is tender. Cremasteric reflex is present. Right testis shows no mass, no swelling and no tenderness. Left testis shows no mass, no swelling and no tenderness. No phimosis, hypospadias or penile tenderness.  Musculoskeletal: Normal range of motion. He exhibits no edema or tenderness.  Clubbing of fingers bilaterally  Lymphadenopathy:       Right: No inguinal adenopathy present.       Left: No inguinal adenopathy present.  Neurological: He is alert and oriented to person, place, and time. No cranial nerve deficit. He exhibits normal muscle tone. Coordination normal.  Skin: Skin is warm and dry. No rash noted. He is not diaphoretic.  Psychiatric: He has a normal mood and affect. His behavior is normal.  Nursing note and vitals reviewed.   ED Course  Procedures (including critical care time) Labs Review Labs Reviewed  GC/CHLAMYDIA PROBE AMP  URINALYSIS, ROUTINE W REFLEX MICROSCOPIC    Imaging Review No results found.   EKG Interpretation None      MDM   Final diagnoses:  Prostatitis, unspecified prostatitis type    The patient is a 28 y.o. M who presents for over 1 year of intermittent testicle pain. Pain alternates between left and right testicle. Associated symptoms of intermittent dysuria of over 1 year. No rectal pain and patient having normal BMs. No fevers. Testicular exam completely benign as above but rectal exam shows mildly tender prostate. Since patient states symptoms started when he had been celebrate for over 1 year will treat with non-std prostatitis treatment but GC Chlamydia  tests sent to be followed up as an outpatient. Patient also encouraged to see PCP about clubbing of fingers which patient states is not new in nature. Patient discharged with PCP resource guide, rx for ciprofloxacin, and standard ED return precautions.  Patient seen with attending, Dr. Donnald GarrePfeiffer, who oversaw clinical decision making.     Lula OlszewskiMike Keijuan Schellhase, MD 12/01/14 1924  Arby BarretteMarcy Pfeiffer, MD 12/04/14 [phone]

## 2014-12-01 NOTE — ED Notes (Signed)
Pt c/o pain off and on in testicles x's 1 yr.  Also pain with urination off and  On  X's 1 yr.

## 2014-12-01 NOTE — Discharge Instructions (Signed)
°Emergency Department Resource Guide °1) Find a Doctor and Pay Out of Pocket °Although you won't have to find out who is covered by your insurance plan, it is a good idea to ask around and get recommendations. You will then need to call the office and see if the doctor you have chosen will accept you as a new patient and what types of options they offer for patients who are self-pay. Some doctors offer discounts or will set up payment plans for their patients who do not have insurance, but you will need to ask so you aren't surprised when you get to your appointment. ° °2) Contact Your Local Health Department °Not all health departments have doctors that can see patients for sick visits, but many do, so it is worth a call to see if yours does. If you don't know where your local health department is, you can check in your phone book. The CDC also has a tool to help you locate your state's health department, and many state websites also have listings of all of their local health departments. ° °3) Find a Walk-in Clinic °If your illness is not likely to be very severe or complicated, you may want to try a walk in clinic. These are popping up all over the country in pharmacies, drugstores, and shopping centers. They're usually staffed by nurse practitioners or physician assistants that have been trained to treat common illnesses and complaints. They're usually fairly quick and inexpensive. However, if you have serious medical issues or chronic medical problems, these are probably not your best option. ° °No Primary Care Doctor: °- Call [facility] at  832-8000 - they can help you locate a primary care doctor that  accepts your insurance, provides certain services, etc. °- Physician Referral Service- 1-800-533-3463 ° °Chronic Pain Problems: °Organization         Address  Phone   Notes  °[facility]  (336) 297-2271 Patients need to be referred by their primary care doctor.  ° °Medication  Assistance: °Organization         Address  Phone   Notes  °[place] [facility] [address] °[place], [place] [address] (336) 641-8030 --Must be a resident of [place] °-- Must have NO insurance coverage whatsoever (no Medicaid/ Medicare, etc.) °-- The pt. MUST have a primary care doctor that directs their care regularly and follows them in the community °  °[facility]  (866) 331-1348   °[employer]  (888) 892-1162   ° °Agencies that provide inexpensive medical care: °Organization         Address  Phone   Notes  °[facility]  (336) 832-8035   °[facility]    (336) 832-7272   °[facility] [address] °[place], [place] [address] (336) 832-4777   °[facility] [address], °[place] (336) 271-4999   °[facility]    (336) 373-0678   °[facility]    (336) 272-1050   °[facility] ° [address], [place] Phone:  (336) 832-4444, Fax:  (336) 832-4440 Hours of Operation:  9 am - 6 pm, M-F.  Also accepts Medicaid/Medicare and self-pay.  °[facility] ° [address], [place] Phone: (336) 832-3150, Fax: (336) 832-3151. Hours of Operation:  8:30 am - 5:30 pm, M-F.  Also accepts Medicaid and self-pay.  °[facility] [place] [address], [place] Phone: (336) 878-6027   °[facility] [address], [place], [place] (336)723-1848, Ext. 123 Mondays & Thursdays: 7-9 AM.  First 15 patients are seen on a first come, first serve basis. °  ° °Medicaid-accepting [place] Providers: ° °Organization         Address  Phone   Notes  °[facility] [address], [place] (336) 641-2100 Also accepts self-pay patients.  °[facility] [address], [place] ° (336) 856-9996   °[facility] [address], [place]  (336) 288-8857   °[facility] [address] [place] [address], [place] (336) 299-7000   °Veita Bland [address], [place]  ° (336) 373-1557 Only accepts [place] Access Medicaid patients after they have their name applied to their card.  ° °Self-Pay (no insurance) in [place]: ° °Organization         Address  Phone   Notes  °Sickle Cell Patients, [facility] [address], [place] (336) 832-1970   °[facility] [address], [place] (336) 832-4400   °[facility] ° [address], [place] (336) 992-4800   °[facility]/Dr. Osei-Bonsu ° [address] [place] [address], [place] or [address], [place] (336) 841-8500 Phone number for both [place] and [place] locations is the same.  °[facility] [address], [place] (336) 299-0000   °[facility] [address] [place] [address], [place] or [address] (336) 852-7530 °(336) 878-2260   °[facility] [address], [place] (336) 350-1642, phone; (336) 294-5005, fax Sees patients 1st and 3rd Saturday of every month.  Must not qualify for public or private insurance (i.e. Medicaid, Medicare, [place] Health Choice, Veterans' Benefits) • Household income should be no more than 200% of the poverty level •The clinic cannot treat you if you are pregnant or think you are pregnant • Sexually transmitted diseases are not treated at the clinic.  ° ° °Dental Care: °Organization         Address  Phone  Notes  °[place] [facility] [address], [place] (336) 641-6152 Accepts children up to age 21 who are enrolled in Medicaid or [place] Health Choice; pregnant women with a Medicaid card; and children who have applied for Medicaid or [place] Health Choice, but were declined, whose parents can pay a reduced fee at time of service.  °[place]  [facility] [place]  [address], [place] (336) 641-7733 Accepts children up to age 21 who are enrolled in Medicaid or [place] Health Choice; pregnant women with a Medicaid card; and children who have applied for Medicaid or [place] Health Choice, but were declined, whose parents can pay a reduced fee at time of service.  °[facility] ° [address], [place] (336) 641-4533 Patients are seen by appointment only. Walk-ins are not accepted. [facility] will see patients 18 years of age and older. °Monday - Tuesday (8am-5pm) °Most Wednesdays (8:30-5pm) °$30 per visit, cash only  °[facility] ° [address], [place] (336) 641-4533 Patients are seen by appointment only. Walk-ins are not accepted. [facility] will see patients 18 years of age and older. °One   Wednesday Evening (Monthly: Volunteer Based).  $30 per visit, cash only  °[facility]  (919) 537-3737 for adults; Children under age 4, call Graduate Pediatric Dentistry at ([phone]) [phone]. Children aged 4-14, please call ([phone]) [phone] to request a pediatric application. ° Dental services are provided in all areas of dental care including fillings, crowns and bridges, complete and partial dentures, implants, gum treatment, root canals, and extractions. Preventive care is also provided. Treatment is provided to both adults and children. °Patients are selected via a lottery and there is often a waiting list. °  °[facility] [address], °[place] ° (336) 763-8833 [email or web address] °  °[facility] [address], [place], [place] (336)723-1848, Ext. 123 Second and Fourth Thursday of each month, opens at 6:30 AM; Clinic ends at 9 AM.  Patients are seen on a first-come first-served basis, and a limited number are seen during each clinic.  ° °[facility] ° [address], [place], [place] (336) 723-7904    Eligibility Requirements °You must have lived in [place], [place], or [place] counties for at least the last three months. °  You cannot be eligible for state or federal sponsored healthcare insurance, including Veterans Administration, Medicaid, or Medicare. °  You generally cannot be eligible for healthcare insurance through your employer.  °  How to apply: °Eligibility screenings are held every Tuesday and Wednesday afternoon from 1:00 pm until 4:00 pm. You do not need an appointment for the interview!  °[facility] [address], [place], [place] 336-631-2330   °[facility]  336-342-8273   °[facility]  336-703-3100   °[facility]  336-570-6415   ° °Behavioral Health Resources in the Community: °Intensive Outpatient Programs °Organization         Address  Phone  Notes  °[facility] [address], [place], [place] 336-878-6098   °[facility] [address], [place], [place] 336-832-9800   °[facility] [address], [place], [place] ° 336-882-2125   °[facility] [address],  °[place], [place] 1-800-853-5163 or 336-641-4981   °Substance Abuse Resources °Organization         Address  Phone  Notes  °[facility]  336-882-2125   °[facility]  336-784-9470   °[facility]  336-285-9073   °[facility]  336-845-3988   °Residential & Outpatient Substance Abuse Program  1-800-659-3381   °Psychological Services °Organization         Address  Phone  Notes  °[facility]  336- 832-9600   °[facility]  336- 378-7881   °[facility] [address], [place] 1-800-853-5163 or 336-641-4981   ° °Mobile Crisis Teams °Organization         Address  Phone  Notes  °[facility], Mobile Crisis Care Unit  1-877-626-1772   °Assertive °[facility] ° [address]  [place], [place] 336-834-9664   °Sharon DeEsch [address] °[place] 336-554-5454   ° °Self-Help/Support Groups °Organization         Address  Phone             Notes  °[facility]  - variety of support groups  336- 373-1402 Call for more information  °Narcotics Anonymous (NA), [facility] [address], °[place]  2 meetings at this location  ° °  Residential Treatment Programs Organization         Address  Phone  Notes  [facility] [address],    [place]  [phone]   [facility]  [address], [place] [phone], [place], [place] [phone]   [facility] [address], [place] [phone]) [phone] Admissions: 8am-3pm M-F  [facility] [address]    [place], [place] 295-284-1324([phone]   [facility] [address], [place], [place] [phone]   [facility] [address]  [place], [place] [phone]   [facility] Intensive Outpatient [address], [place], [place] [phone]   [facility] ([facility] [address]  [place], [place] [phone]-(308)[phone] or [phone]   [facility] ([facility] [address] [place], [place] 323-557-3220([phone] Accepts Medicaid  [facility] [address]  [place] [phone] Substance Abuse/Addiction Treatment   [facility]         Address  Phone  Notes  [facility]  (941)738-1304(888) (743) [phone]   Angie FavaJulie Brannon, PhD [address], [address] [place], [place]   ([phone](336) [phone] or ([phone](336) [phone]   [facility]   [address] [place], [place] [phone](336) [phone]   [facility] [address], [place], [place] [phone](336) ([phone] Insurance/Medicaid/sponsorship through [facility] [address]                                    [place], [place] ([phone]) [phone](336) [phone] Therapy/tele-psych/case    [facility] [address]   [place], [place] [phone](336) [phone]    Dr. Lolly MustacheArfeen  [phone]) [phone]   [facility]  [employer] [facility] 1) [address], [place] 2) [address], [place] 3)  [address], [place] [phone](336) [phone]) [phone]  [phone](336) [phone]   [facility] Child Abuse Hotline 3323665395(336) [phone] or (402)[phone](336) ([phone] (After Hours)       Prostatitis The prostate gland is about the size and shape of a walnut. It is located just below your bladder. It produces one of the components of semen, which is made up of sperm and the fluids that help nourish and transport it out from the testicles. Prostatitis is inflammation of the prostate gland.  There are four types of prostatitis:  Acute bacterial prostatitis. This is the least common type of prostatitis. It starts quickly and usually is associated with a bladder infection, high fever, and shaking chills. It can occur at any age.  Chronic bacterial prostatitis. This is a persistent bacterial infection in the prostate. It usually develops from repeated acute bacterial prostatitis or acute bacterial prostatitis that was not properly treated. It can occur in men of any age but is most common in middle-aged men whose prostate has begun to enlarge. The symptoms are not as severe as those in acute bacterial prostatitis. Discomfort in the part of your body that is in front of your rectum and below your scrotum (perineum), lower abdomen, or in the head of your penis (glans) may represent your primary discomfort.  Chronic prostatitis (nonbacterial). This is the most common type of prostatitis. It is inflammation of the prostate gland that is not caused by a bacterial infection. The cause is unknown and may be associated with a viral infection  or autoimmune disorder.  Prostatodynia (pelvic floor disorder). This is associated with increased muscular tone in the pelvis surrounding the  prostate. CAUSES The causes of bacterial prostatitis are bacterial infection. The causes of the other types of prostatitis are unknown.  SYMPTOMS  Symptoms can vary depending upon the type of prostatitis that exists. There can also be overlap in symptoms. Possible symptoms for each type of prostatitis are listed below. Acute Bacterial Prostatitis  Painful urination.  Fever or chills.  Muscle or joint pains.  Low back pain.  Low abdominal pain.  Inability to empty bladder completely. Chronic Bacterial Prostatitis, Chronic Nonbacterial Prostatitis, and Prostatodynia  Sudden urge to urinate.  Frequent urination.  Difficulty starting urine stream.  Weak urine stream.  Discharge from the urethra.  Dribbling after urination.  Rectal pain.  Pain in the testicles, penis, or tip of the penis.  Pain in the perineum.  Problems with sexual function.  Painful ejaculation.  Bloody semen. DIAGNOSIS  In order to diagnose prostatitis, your health care provider will ask about your symptoms. One or more urine samples will be taken and tested (urinalysis). If the urinalysis result is negative for bacteria, your health care provider may use a finger to feel your prostate (digital rectal exam). This exam helps your health care provider determine if your prostate is swollen and tender. It will also produce a specimen of semen that can be analyzed. TREATMENT  Treatment for prostatitis depends on the cause. If a bacterial infection is the cause, it can be treated with antibiotic medicine. In cases of chronic bacterial prostatitis, the use of antibiotics for up to 1 month or 6 weeks may be necessary. Your health care provider may instruct you to take sitz baths to help relieve pain. A sitz bath is a bath of hot water in which your hips and buttocks are under water. This relaxes the pelvic floor muscles and often helps to relieve the pressure on your prostate. HOME CARE INSTRUCTIONS   Take all  medicines as directed by your health care provider.  Take sitz baths as directed by your health care provider. SEEK MEDICAL CARE IF:   Your symptoms get worse, not better.  You have a fever. SEEK IMMEDIATE MEDICAL CARE IF:   You have chills.  You feel nauseous or vomit.  You feel lightheaded or faint.  You are unable to urinate.  You have blood or blood clots in your urine. MAKE SURE YOU:  Understand these instructions.  Will watch your condition.  Will get help right away if you are not doing well or get worse. Document Released: 11/15/2000 Document Revised: 11/23/2013 Document Reviewed: 06/07/2013 [facility] Patient Information 2015 [employer] [place]. This information is not intended to replace advice given to you by your health care provider. Make sure you discuss any questions you have with your health care provider.

## 2014-12-05 LAB — GC/CHLAMYDIA PROBE AMP
CT Probe RNA: NEGATIVE
GC PROBE AMP APTIMA: NEGATIVE

## 2015-12-14 ENCOUNTER — Encounter (HOSPITAL_COMMUNITY): Payer: Self-pay

## 2015-12-14 ENCOUNTER — Emergency Department (HOSPITAL_COMMUNITY)
Admission: EM | Admit: 2015-12-14 | Discharge: 2015-12-14 | Disposition: A | Discharge status: Home / Self Care | Payer: Self-pay | Attending: Physician Assistant | Admitting: Physician Assistant

## 2015-12-14 DIAGNOSIS — Z8659 Personal history of other mental and behavioral disorders: Secondary | ICD-10-CM | POA: Insufficient documentation

## 2015-12-14 DIAGNOSIS — M5416 Radiculopathy, lumbar region: Secondary | ICD-10-CM | POA: Insufficient documentation

## 2015-12-14 DIAGNOSIS — M25559 Pain in unspecified hip: Secondary | ICD-10-CM | POA: Insufficient documentation

## 2015-12-14 DIAGNOSIS — F17210 Nicotine dependence, cigarettes, uncomplicated: Secondary | ICD-10-CM | POA: Insufficient documentation

## 2015-12-14 DIAGNOSIS — Z792 Long term (current) use of antibiotics: Secondary | ICD-10-CM | POA: Insufficient documentation

## 2015-12-14 DIAGNOSIS — I10 Essential (primary) hypertension: Secondary | ICD-10-CM | POA: Insufficient documentation

## 2015-12-14 HISTORY — DX: Depression, unspecified: F32.A

## 2015-12-14 HISTORY — DX: Major depressive disorder, single episode, unspecified: F32.9

## 2015-12-14 HISTORY — DX: Essential (primary) hypertension: I10

## 2015-12-14 HISTORY — DX: Bipolar disorder, unspecified: F31.9

## 2015-12-14 MED ORDER — METHOCARBAMOL 500 MG PO TABS: 1000.0000 mg | ORAL_TABLET | Freq: Four times a day (QID) | ORAL | Status: DC | PRN

## 2015-12-14 MED ORDER — KETOROLAC TROMETHAMINE 60 MG/2ML IM SOLN
30.0000 mg | Freq: Once | INTRAMUSCULAR | Status: AC
  Administered 2015-12-14: 30 mg via INTRAMUSCULAR
  Filled 2015-12-14: qty 2

## 2015-12-14 NOTE — Discharge Instructions (Signed)
Do not hesitate to return to the emergency room for any new, worsening or concerning symptoms.  Please obtain primary care using resource guide below. Let them know that you were seen in the emergency room and that they will need to obtain records for further outpatient management.  For pain control you may take up to 800mg  of Motrin (also known as ibuprofen). That is usually 4 over the counter pills,  3 times a day. Take with food to minimize stomach irritation   For breakthrough pain you may take Robaxin. Do not drink alcohol, drive or operate heavy machinery when taking Robaxin.     Emergency Department Resource Guide 1) Find a Doctor and Pay Out of Pocket Although you won't have to find out who is covered by your insurance plan, it is a good idea to ask around and get recommendations. You will then need to call the office and see if the doctor you have chosen will accept you as a new patient and what types of options they offer for patients who are self-pay. Some doctors offer discounts or will set up payment plans for their patients who do not have insurance, but you will need to ask so you aren't surprised when you get to your appointment.  2) Contact Your Local Health Department Not all health departments have doctors that can see patients for sick visits, but many do, so it is worth a call to see if yours does. If you don't know where your local health department is, you can check in your phone book. The CDC also has a tool to help you locate your state's health department, and many state websites also have listings of all of their local health departments.  3) Find a Walk-in Clinic If your illness is not likely to be very severe or complicated, you may want to try a walk in clinic. These are popping up all over the country in pharmacies, drugstores, and shopping centers. They're usually staffed by nurse practitioners or physician assistants that have been trained to treat common illnesses  and complaints. They're usually fairly quick and inexpensive. However, if you have serious medical issues or chronic medical problems, these are probably not your best option.  No Primary Care Doctor: - Call Health Connect at  [phone] - they can help you locate a primary care doctor that  accepts your insurance, provides certain services, etc. - Physician Referral Service- [phone]  Chronic Pain Problems: Organization         Address  Phone   Notes  [facility]  [phone] Patients need to be referred by their primary care doctor.   Medication Assistance: Organization         Address  Phone   Notes  [facility] Medication [facility] [address] [place], [place] [address] [phone] --Must be a resident of [place] -- Must have NO insurance coverage whatsoever (no Medicaid/ Medicare, etc.) -- The pt. MUST have a primary care doctor that directs their care regularly and follows them in the community   MedAssist  [phone]   [employer]  [phone]    Agencies that provide inexpensive medical care: Organization         Address  Phone   Notes  [facility]  [phone]   [facility]    [phone]   [facility] [address] [place], [place] [address] [phone]  [facility] [address] [place] [phone]   Planned Parenthood    [phone]   [facility]    [phone]   [place] and [place] [address], [place] Phone:  [phone], Fax:  [phone] Hours of Operation:  9 am - 6 pm, M-F.  Also accepts Medicaid/Medicare and self-pay.  [facility] for [place] [address], [address], [place] Phone: [phone], Fax: [phone]. Hours of Operation:  8:30 am - 5:30 pm, M-F.  Also accepts Medicaid and self-pay.  [facility] [place] [address] [place] Phone: [phone]   [place], [place], [place] [phone], Ext. [phone] Mondays & Thursdays: 7-9 AM.  First 15 patients are seen on a first come, first serve basis.    [place] Providers:  Organization         Address  Phone   Notes  [facility] [address], [place] [phone] Also accepts self-pay patients.  [facility] [address], [place]  [phone]   [place], [address], [place] [phone]   [facility] [address], [place] [phone]   Lucianne Lei [address], [place]   [phone] Only accepts [place] Access [place] patients after they have their name applied to their card.   Self-Pay (no insurance) in [facility] [place]:  Organization         Address  Phone   Notes  Sickle Cell Patients, [facility] [place] [phone]   [facility] [place] [phone]   [facility] [place]  [place], [place], [place] [phone]   [facility]/Dr. Osei-Bonsu  [address] [place] or [address], [place] [phone] Phone number for both [place] and [place] locations is the same.  Urgent Medical and [facility] [address] [place] [phone]   [facility] [address] [place] or [address] [phone]   [facility] [address], [place] [phone], phone; [phone], fax Sees patients 1st and 3rd Saturday of every month.  Must not qualify for public or private insurance (i.e. Medicaid, Medicare, [place] Health Choice, Veterans' Benefits)  Household income should be no more than 200% of the poverty level  The clinic cannot treat you if you are pregnant or think you are pregnant  Sexually transmitted diseases are not treated at the clinic.    Dental Care: Organization         Address  Phone  Notes  [facility] [place] [phone] Accepts children up to age 97 who are enrolled in [place] or [place]; pregnant women with a Medicaid card; and children who have applied for Medicaid or [place] Health Choice, but were declined, whose parents can pay a reduced fee at time of service.  [facility]  [address], [place] [phone] Accepts children up to age 11 who are enrolled in [place] or [place]; pregnant women with a Medicaid card; and  children who have applied for Medicaid or [place] Health Choice, but were declined, whose parents can pay a reduced fee at time of service.  [place] Adult [facility] Access PROGRAM  [place] [phone] Patients are seen by appointment only. Walk-ins are not accepted. [place] will see patients 65 years of age and older. Monday - Tuesday (8am-5pm) Most Wednesdays (8:30-5pm) $30 per visit, cash only  [place] [place] Adult Dental Access PROGRAM  [address], [facility] [phone] Patients are seen by appointment only. Walk-ins are not accepted. [place] will see patients 17 years of age and older. One Wednesday Evening (Monthly: Volunteer Based).  $30 per visit, cash only  [place]  [phone] for adults; Children under age 53, call Graduate Pediatric Dentistry at [phone]. Children aged 31-14, please call [phone] to request a pediatric application.  Dental services are provided in all areas of dental care including fillings, crowns and bridges, complete and partial dentures, implants, gum treatment, root canals, and extractions. Preventive care is  also provided. Treatment is provided to both adults and children. Patients are selected via a lottery and there is often a waiting list.   [facility] [address] [place]  [phone] [email or web address]   [facility] [address] [place], [place] [phone], Ext. [phone] Second and Fourth Thursday of each month, opens at 6:30 AM; Clinic ends at 9 AM.  Patients are seen on a first-come first-served basis, and a limited number are seen during each clinic.   [facility]  [address] [place]ard Danker [place], [place] [phone]   Eligibility Requirements You must have lived in [place], [place], or [place] counties for at least the last three months.   You cannot be eligible for state or federal sponsored [employer], including [employer], [place], or [employer].   You generally cannot be eligible for healthcare insurance through your employer.    How to apply: Eligibility screenings are held every Tuesday and Wednesday afternoon from 1:00 pm until 4:00 pm. You do not need an appointment for the interview!  [facility] [address] [place], [place]   [place]  [place] Department  [place]  [phone]    Behavioral Health Resources in the Community: Intensive Outpatient Programs Organization         Address  Phone  Notes  [place]. [address] [place], [place] [phone]   [facility] [address], [place], [place]   ADS: Alcohol & Drug Svcs [address] [place], [place]   [place] [address],  [place], [place] or [phone]   Substance Abuse Resources Organization         Address  Phone  Notes  Alcohol and Drug Services  [phone]   [place]  [phone]   [facility] [place]   Chinita Pester  [phone]   Residential & Outpatient Substance Abuse Program  [phone]   Psychological Services Organization         Address  Phone  Notes  [facility] [place]  [place]  [phone]   [place] [address], [place] or [phone]    Mobile Crisis Teams Organization  Address  Phone  Notes  [facility], Mobile Crisis Care Unit  [phone]   [facility]ive Psychotherapeutic Services  [address] [place], [place]   [facility] [address], [place] [phone]    Self-Help/Support Groups Organization         Address  Phone             Notes  [place]. of [place] - variety of support groups  [place] Call for more information  Narcotics Anonymous (NA), [facility] [address], [employer] [place]  2 meetings at this location   Special educational needs teacher         Address  Phone  Notes  [facility]idential Treatment [place],    [place]  [phone]   [facility]  [address] [place] [phone], [place], [place]   [place], [place] [phone] Admissions: 8am-3pm M-F  Incentives Substance [place] [address]    [place], [place] [phone]   [facility] [address] [place], [place], [place]   [facility] [address],  [place], [place]   [facility] Programs - Intensive Outpatient [address], [place], [place]   [facility] [place].,  [place], [place] [phone] or [phone]   [facility] (RTS) [address] [place], [place] Accepts Medicaid  [facility] [place] [address]  [place] [phone]  Substance Abuse/Addiction Treatment   [facility] Organization         Address  Phone  Notes  [facility]  [phone]   Domenic Schwab, PhD [address] Arlis Port[place], [place]   [phone] or [phone]   [place], [place] [phone]   [facility] [address] [place], [place] [phone] Insurance/Medicaid/sponsorship through [facility] and Families [address], [address] [place]                                    [place], [place] [phone] [place] [address], [place] [phone]    Dr. Adele Schilder  [phone]   [facility] Dept. 1) [address] [place] 2) [place] 3)  [place] [address], [place] [phone]  [phone]   [place] [phone] or [phone] (After Hours)

## 2015-12-14 NOTE — ED Notes (Addendum)
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 Pt also states he'll have neck pain ever so often.

## 2015-12-14 NOTE — ED Notes (Signed)
Pt stable, ambulatory, states understanding of discharge instructions 

## 2015-12-14 NOTE — ED Provider Notes (Signed)
CSN: [id number]     Arrival date & time 12/14/15  1954 History  By signing my name below, I, Lyndel Safe, attest that this documentation has been prepared under the direction and in the presence of [employer], PA-C. Electronically Signed: Lyndel Safe, ED Scribe. 12/14/2015. 9:41 PM.   Chief Complaint  Patient presents with  . Back Pain  . Hip Pain   The history is provided by the patient. No language interpreter was used.   HPI Comments: Julian Pugh is a 30 y.o. 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 He reports he takes tylenol and ibuprofen chronically for this pain without significant relief. Pt denies fevers, a h/o of cancer, a h/o IV drug abuse, bowel or bladder incontinence, numbness, tingling or weakness. Pt is not followed by a PCP.   Past Medical History  Diagnosis Date  . Hypertension   . Bipolar 1 disorder (HCC)   . Depression    History reviewed. No pertinent past surgical history. No family history on file. Social History  Substance Use Topics  . Smoking status: Current Every Day Smoker -- 1.00 packs/day    Types: Cigarettes  . Smokeless tobacco: None  . Alcohol Use: Yes    Review of Systems A complete 10 system review of systems was obtained and is otherwise negative except at noted in the HPI and PMH.  Allergies  Review of patient's allergies indicates no known allergies.  Home Medications   Prior to Admission medications   Medication Sig Start Date End Date Taking? Authorizing Provider  ciprofloxacin (CIPRO) 500 MG tablet Take 1 tablet (500 mg total) by mouth every 12 (twelve) hours. 12/01/14   [name], MD  HYDROcodone-acetaminophen (NORCO/VICODIN) 5-325 MG per tablet Take 1 tablet by mouth every 6 (six) hours as needed. Patient not taking: Reported on 12/01/2014 11/30/13    [name], PA-C  methocarbamol (ROBAXIN) 500 MG tablet Take 2 tablets (1,000 mg total) by mouth 4 (four) times daily as needed (Pain). 12/14/15   Shekera Beavers, PA-C  triamcinolone cream (KENALOG) 0.1 % Apply 1 application topically daily as needed (dry skin and eczema).  08/24/14   Historical Provider, MD   BP 135/75 mmHg  Pulse 93  Temp(Src) 97.9 F (36.6 C) (Oral)  Resp 20  Ht   Wt 145 lb 7 oz (65.97 kg)  SpO2 97% Physical Exam  Constitutional: He is oriented to person, place, and time. He appears well-developed and well-nourished. No distress.  HENT:  Head: Normocephalic.  Eyes: Conjunctivae are normal.  Neck: Normal range of motion. Neck supple.  Cardiovascular: Normal rate.   Pulmonary/Chest: Effort normal. No respiratory distress.  Musculoskeletal: Normal range of motion.  No point tenderness to percussion of lumbar spinal processes.  No TTP or paraspinal muscular spasm. Strength is 5 out of 5 to bilateral lower extremities at hip and knee; extensor hallucis longus 5 out of 5. Ankle strength 5 out of 5, no clonus, neurovascularly intact. No saddle anaesthesia. Patellar reflexes are 2+ bilaterally.   Ambulated with a slow but coordinated gait  Positive straight leg raise on the left at 40 degrees. Negative straight leg raise on the right.   Neurological: He is alert and oriented to person, place, and time. Coordination normal.  Skin: Skin is warm.  Psychiatric: He has a normal mood and affect. His behavior is  normal.  Nursing note and vitals reviewed.   ED Course  Procedures  DIAGNOSTIC STUDIES: Oxygen Saturation is 97% on RA, normal by my interpretation.    COORDINATION OF CARE: 9:39 PM Imaging not indicated at this time. Will order Toradol IM for pain management in the ED. Discussed treatment plan which includes to prescribe a short course of a muscle relaxer with pt. Advised pt to continue taking ibuprofen 800mg  TID. Will give referral to neurosurgery and PCP  resources guide. Pt acknowledges and agrees to plan.    MDM   Final diagnoses:  Lumbar radiculopathy, chronic    Filed Vitals:   12/14/15 2004 12/14/15 2147  BP: 135/75 128/77  Pulse: 93 69  Temp: 97.9 F (36.6 C)   TempSrc: Oral   Resp: 20 16  Weight: 65.97 kg   SpO2: 97% 100%    Medications  ketorolac (TORADOL) injection 30 mg (30 mg Intramuscular Given 12/14/15 2154)    Julian Pugh is 30 y.o. male presenting with low back pain radiating down left leg, he's been having this intermittently for several months.   No neurological deficits and normal neuro exam.  Patient can walk but states is painful.  No loss of bowel or bladder control.  No concern for cauda equina.  No fever, night sweats, weight loss, h/o cancer, IVDU.  RICE protocol and pain medicine indicated and discussed with patient.  Evaluation does not show pathology that would require ongoing emergent intervention or inpatient treatment. Pt is hemodynamically stable and mentating appropriately. Discussed findings and plan with patient/guardian, who agrees with care plan. All questions answered. Return precautions discussed and outpatient follow up given.   Discharge Medication List as of 12/14/2015  9:45 PM    START taking these medications   Details  methocarbamol (ROBAXIN) 500 MG tablet Take 2 tablets (1,000 mg total) by mouth 4 (four) times daily as needed (Pain)., Starting 12/14/2015, Until Discontinued, Print         I personally performed the services described in this documentation, which was scribed in my presence. The recorded information has been reviewed and is accurate.   Wynetta Emeryicole Jarelyn Bambach, PA-C 12/15/15 0055  Courteney Randall AnLyn Mackuen, MD 12/19/15 [id number]

## 2016-06-21 ENCOUNTER — Emergency Department (HOSPITAL_COMMUNITY)
Admission: EM | Admit: 2016-06-21 | Discharge: 2016-06-21 | Disposition: A | Discharge status: Home / Self Care | Payer: Self-pay | Attending: Emergency Medicine | Admitting: Emergency Medicine

## 2016-06-21 ENCOUNTER — Emergency Department (HOSPITAL_COMMUNITY): Payer: Self-pay

## 2016-06-21 ENCOUNTER — Encounter (HOSPITAL_COMMUNITY): Payer: Self-pay

## 2016-06-21 DIAGNOSIS — Z79899 Other long term (current) drug therapy: Secondary | ICD-10-CM | POA: Insufficient documentation

## 2016-06-21 DIAGNOSIS — M545 Low back pain, unspecified: Secondary | ICD-10-CM

## 2016-06-21 DIAGNOSIS — F17210 Nicotine dependence, cigarettes, uncomplicated: Secondary | ICD-10-CM | POA: Insufficient documentation

## 2016-06-21 DIAGNOSIS — I10 Essential (primary) hypertension: Secondary | ICD-10-CM | POA: Insufficient documentation

## 2016-06-21 DIAGNOSIS — G8929 Other chronic pain: Secondary | ICD-10-CM | POA: Insufficient documentation

## 2016-06-21 DIAGNOSIS — F1290 Cannabis use, unspecified, uncomplicated: Secondary | ICD-10-CM | POA: Insufficient documentation

## 2016-06-21 DIAGNOSIS — Z792 Long term (current) use of antibiotics: Secondary | ICD-10-CM | POA: Insufficient documentation

## 2016-06-21 DIAGNOSIS — F319 Bipolar disorder, unspecified: Secondary | ICD-10-CM | POA: Insufficient documentation

## 2016-06-21 MED ORDER — ACETAMINOPHEN 500 MG PO TABS
1000.0000 mg | ORAL_TABLET | Freq: Once | ORAL | Status: AC
Start: 2016-06-21 — End: 2016-06-21
  Administered 2016-06-21: 1000 mg via ORAL
  Filled 2016-06-21: qty 2

## 2016-06-21 MED ORDER — KETOROLAC TROMETHAMINE 60 MG/2ML IM SOLN
60.0000 mg | Freq: Once | INTRAMUSCULAR | Status: AC
  Administered 2016-06-21: 60 mg via INTRAMUSCULAR
  Filled 2016-06-21: qty 2

## 2016-06-21 MED ORDER — KETOROLAC TROMETHAMINE 30 MG/ML IJ SOLN: 30.0000 mg | Freq: Once | INTRAMUSCULAR | Status: DC

## 2016-06-21 MED ORDER — OXYCODONE HCL 5 MG PO TABS
5.0000 mg | ORAL_TABLET | Freq: Once | ORAL | Status: AC
  Administered 2016-06-21: 5 mg via ORAL
  Filled 2016-06-21: qty 1

## 2016-06-21 NOTE — ED Notes (Signed)
Bed: WA15 Expected date:  Expected time:  Means of arrival:  Comments: EMS  

## 2016-06-21 NOTE — ED Notes (Signed)
Per EMS- Pt arriving with [employer] and [employer] c/o lower back pain radiating down his left leg after struggle with police when being arrested. No incontinence or numbness noted. Hx of herniated disc.
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
## 2016-06-21 NOTE — Discharge Instructions (Signed)
Take 4 over the counter ibuprofen tablets 3 times a day or 2 over-the-counter naproxen tablets twice a day for pain. °Also take tylenol 1000mg(2 extra strength) four times a day.  ° °  ° °Back Pain, Adult °Back pain is very common in adults. The cause of back pain is rarely dangerous and the pain often gets better over time. The cause of your back pain may not be known. Some common causes of back pain include: °· Strain of the muscles or ligaments supporting the spine. °· Wear and tear (degeneration) of the spinal disks. °· Arthritis. °· Direct injury to the back. °For many people, back pain may return. Since back pain is rarely dangerous, most people can learn to manage this condition on their own. °HOME CARE INSTRUCTIONS °Watch your back pain for any changes. The following actions may help to lessen any discomfort you are feeling: °· Remain active. It is stressful on your back to sit or stand in one place for long periods of time. Do not sit, drive, or stand in one place for more than 30 minutes at a time. Take short walks on even surfaces as soon as you are able. Try to increase the length of time you walk each day. °· Exercise regularly as directed by your health care provider. Exercise helps your back heal faster. It also helps avoid future injury by keeping your muscles strong and flexible. °· Do not stay in bed. Resting more than 1-2 days can delay your recovery. °· Pay attention to your body when you bend and lift. The most comfortable positions are those that put less stress on your recovering back. Always use proper lifting techniques, including: °¨ Bending your knees. °¨ Keeping the load close to your body. °¨ Avoiding twisting. °· Find a comfortable position to sleep. Use a firm mattress and lie on your side with your knees slightly bent. If you lie on your back, put a pillow under your knees. °· Avoid feeling anxious or stressed. Stress increases muscle tension and can worsen back pain. It is important  to recognize when you are anxious or stressed and learn ways to manage it, such as with exercise. °· Take medicines only as directed by your health care provider. Over-the-counter medicines to reduce pain and inflammation are often the most helpful. Your health care provider may prescribe muscle relaxant drugs. These medicines help dull your pain so you can more quickly return to your normal activities and healthy exercise. °· Apply ice to the injured area: °¨ Put ice in a plastic bag. °¨ Place a towel between your skin and the bag. °¨ Leave the ice on for 20 minutes, 2-3 times a day for the first 2-3 days. After that, ice and heat may be alternated to reduce pain and spasms. °· Maintain a healthy weight. Excess weight puts extra stress on your back and makes it difficult to maintain good posture. °SEEK MEDICAL CARE IF: °· You have pain that is not relieved with rest or medicine. °· You have increasing pain going down into the legs or buttocks. °· You have pain that does not improve in one week. °· You have night pain. °· You lose weight. °· You have a fever or chills. °SEEK IMMEDIATE MEDICAL CARE IF:  °· You develop new bowel or bladder control problems. °· You have unusual weakness or numbness in your arms or legs. °· You develop nausea or vomiting. °· You develop abdominal pain. °· You feel faint. °  °This information is not intended to replace advice given to   by your health care provider. Make sure you discuss any questions you have with your health care provider.   Document Released: 11/18/2005 Document Revised: 12/09/2014 Document Reviewed: 03/22/2014 Elsevier Interactive Patient Education [employer]

## 2016-06-21 NOTE — ED Notes (Signed)
Pt. Getting into gown giving permission for this NT to cut his tank top off. Pt. Requesting to take tank top with him.

## 2016-10-14 NOTE — ED Notes (Signed)
At patient's request, chart accessed to verify arrival and departure times. (11/13 @ 1318)

## 2016-11-12 ENCOUNTER — Ambulatory Visit (INDEPENDENT_AMBULATORY_CARE_PROVIDER_SITE_OTHER): Payer: Self-pay | Admitting: Family Medicine

## 2016-11-12 VITALS — BP 122/72 | HR 68 | Temp 98.6°F | Resp 18 | Ht 67.5 in | Wt 144.0 lb

## 2016-11-12 DIAGNOSIS — F329 Major depressive disorder, single episode, unspecified: Secondary | ICD-10-CM

## 2016-11-12 DIAGNOSIS — M5442 Lumbago with sciatica, left side: Secondary | ICD-10-CM

## 2016-11-12 DIAGNOSIS — Z024 Encounter for examination for driving license: Secondary | ICD-10-CM

## 2016-11-12 DIAGNOSIS — R3121 Asymptomatic microscopic hematuria: Secondary | ICD-10-CM

## 2016-11-12 NOTE — Progress Notes (Signed)
Subjective:  By signing my name below, I, Julian Pugh, attest that this documentation has been prepared under the direction and in the presence of Julian StaggersJeffrey Caitlan Chauca, MD. Electronically Signed: Stann Oresung-Kai Pugh, Scribe. 11/12/2016 , 4:46 PM .  Patient was seen in Room 9 .   Patient ID: Julian Pugh, male    DOB: 06/11/1986, 30 y.o.   MRN: [id number] Chief Complaint  Patient presents with  . Employment Physical    DOT    HPI Julian Pugh Majeed is a 30 y.o. male  Here for DOT physical. He has history of back problems and depression. He reports occasional alcohol and tobacco use. He is currently waiting on insurance. 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 He denies any vision problems. He denies history of OSA.   Depression He just started [employer] for depression yesterday. His depression started when he returned to school in Aug this year. It manifested with him not being able to compete in school with the other younger classmates. He is [facility] by [facility] outpatient. He denies SI, HI, or self-injury. He denies hospitalization with his depression. He will follow up with [facility] on Dec 26th. He drinks about 2 beers a week. He denies any illicit drug use.   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 He denies back surgeries in the past. His back pain would also occasionally radiate down his left leg with shooting pain, ongoing for a year. He usually goes to the ER for treatment and sometimes treat with OTC medications. He has been able to play football, and able to use his leg without any limitation normally.   Patient Active Problem List   Diagnosis Date Noted  . Atopic eczema 10/30/2011   Past Medical History:  Diagnosis Date  . Bipolar 1 disorder (HCC)   . Depression   . Hypertension    No past surgical history on file. No Known Allergies Prior to Admission medications   Medication Sig Start Date End  Date Taking? Authorizing Provider  ciprofloxacin (CIPRO) 500 MG tablet Take 1 tablet (500 mg total) by mouth every 12 (twelve) hours. 12/01/14  Yes Lula OlszewskiMike Goebel, MD  methocarbamol (ROBAXIN) 500 MG tablet Take 2 tablets (1,000 mg total) by mouth 4 (four) times daily as needed (Pain). 12/14/15  Yes [name], PA-C  triamcinolone cream (KENALOG) 0.1 % Apply 1 application topically daily as needed (dry skin and eczema).  08/24/14  Yes Historical Provider, MD   Social History   Social History  . Marital status: Single    Spouse name: N/A  . Number of children: N/A  . Years of education: N/A   Occupational History  . Not on file.   Social History Main Topics  . Smoking status: Current Every Day Smoker    Packs/day: 0.50    Years: 11.00    Types: Cigarettes  . Smokeless tobacco: Never Used  . Alcohol use Yes  . Drug use:     Types: Marijuana  . Sexual activity: No   Other Topics Concern  . Not on file   Social History Narrative  . No narrative on file   Review of Systems  Constitutional: Negative for fatigue and unexpected weight change.  Eyes: Negative for visual disturbance.  Respiratory: Negative for cough, chest tightness and shortness of breath.   Cardiovascular: Negative for chest pain, palpitations and leg swelling.  Gastrointestinal: Negative for abdominal pain and blood in stool.  Musculoskeletal: Positive for arthralgias, back pain and myalgias.  Neurological: Negative for dizziness, weakness, light-headedness and headaches.  Psychiatric/Behavioral: Negative for self-injury and suicidal ideas. The patient is nervous/anxious.        Objective:   Physical Exam  Constitutional: He is oriented to person, place, and time. He appears well-developed and well-nourished. No distress.  HENT:  Head: Normocephalic and atraumatic.  Eyes: EOM are normal. Pupils are equal, round, and reactive to light.  Neck: Neck supple.  Cardiovascular: Normal rate.   Pulmonary/Chest:  Effort normal. No respiratory distress.  Musculoskeletal: Normal range of motion.  Negative seated straight leg raise bilaterally, lower extremities' strength equal bilaterally, Pugh-spine full ROM, able to heel-toe walk without difficulty  Neurological: He is alert and oriented to person, place, and time.  Skin: Skin is warm and dry.  Psychiatric: He has a normal mood and affect. His behavior is normal.  Nursing note and vitals reviewed.   Vitals:   11/12/16 1540  BP: 122/72  Pulse: 68  Resp: 18  Temp: 98.6 F (37 C)  TempSrc: Oral  SpO2: 100%  Weight: 144 lb (65.3 kg)  Height: 5' 7.5" (1.715 m)    Visual Acuity Screening   Right eye Left eye Both eyes  Without correction: 20/15 20/15 20/13   With correction:     Comments: Colors: 6/6  Hearing Screening Comments: Peripheral Vision: Right eye 85  degrees. Left eye 85  Degrees.   Whisper test: 50ft pass     Assessment & Plan:   TESEAN STUMP is a 30 y.o. male Encounter for commercial driver medical examination (CDME)  Reactive depression  Left-sided low back pain with left-sided sciatica, unspecified chronicity  Asymptomatic microscopic hematuria  DOT physical, see provided paperwork. Recent start of medication for reactive depression, but based on discussion of symptoms appears to be mild, controlled with recent medication and has sufficient follow-up with mental health care provider.  - One year card was provided due to diagnosis of depression.  - Intermittent low back pain, no weakness or concerning findings on exam.  - DOT paperwork completed, but recommended follow-up with primary care provider for back pain, as well as trace blood on urine that was asymptomatic.  No orders of the defined types were placed in this encounter.  Patient Instructions    One year card due to Department of Transportation recommendations with treatment of depression. Follow-up with mental health provider as planned. If any  worsening of symptoms, discuss with your mental health provider, and do not drive if you do have acutely worsened depression symptoms.  Range of motion, stretches for your back pain. Follow-up with primary care provider if that persists or any worsening. Do not drive if you have any back pain that is causing any weakness or persistent pain into your leg.  Urine test in office did pick up trace blood possibly. However that can be repeated by primary care provider in the next 6 weeks to see if that is still present. If you notice blood in the urine, return sooner. Make sure you're drinking plenty of fluids throughout the day and make sure you are well hydrated when repeating that urine tests.   IF you received an x-ray today, you will receive an invoice from [facility]. Please contact [facility] at [phone] with questions or concerns regarding your invoice.   IF you received labwork today, you will receive an invoice from [employer] [facility]. Please contact [facility] at [phone] with questions or concerns regarding  your invoice.   Our billing staff will not be able to assist you with questions regarding bills from these companies.  You will be contacted with the lab results as soon as they are available. The fastest way to get your results is to activate your My Chart account. Instructions are located on the last page of this paperwork. If you have not heard from us regarding the results in 2 weeks, please contact this office.        I personally performed the services described in this documentation, which was scribed in my presence. The recorded information has been reviewed and considered, and addended by me as needed.   Signed,   Julian StaggersJeffrey Gwynn Chalker, MD [facility].  11/13/16 5:17 PM

## 2016-11-12 NOTE — Patient Instructions (Addendum)
  One year card due to Department of Transportation recommendations with treatment of depression. Follow-up with mental health provider as planned. If any worsening of symptoms, discuss with your mental health provider, and do not drive if you do have acutely worsened depression symptoms.  Range of motion, stretches for your back pain. Follow-up with primary care provider if that persists or any worsening. Do not drive if you have any back pain that is causing any weakness or persistent pain into your leg.  Urine test in office did pick up trace blood possibly. However that can be repeated by primary care provider in the next 6 weeks to see if that is still present. If you notice blood in the urine, return sooner. Make sure you're drinking plenty of fluids throughout the day and make sure you are well hydrated when repeating that urine tests.   IF you received an x-ray today, you will receive an invoice from [facility]. Please contact [facility] at [phone] with questions or concerns regarding your invoice.   IF you received labwork today, you will receive an invoice from [facility]. Please contact [facility] at [phone] with questions or concerns regarding your invoice.   Our billing staff will not be able to assist you with questions regarding bills from these companies.  You will be contacted with the lab results as soon as they are available. The fastest way to get your results is to activate your My Chart account. Instructions are located on the last page of this paperwork. If you have not heard from us regarding the results in 2 weeks, please contact this office.

## 2016-12-03 ENCOUNTER — Ambulatory Visit (HOSPITAL_COMMUNITY)
Admission: EM | Admit: 2016-12-03 | Discharge: 2016-12-03 | Disposition: A | Discharge status: Home / Self Care | Payer: Self-pay | Attending: Family Medicine | Admitting: Family Medicine

## 2016-12-03 ENCOUNTER — Encounter (HOSPITAL_COMMUNITY): Payer: Self-pay | Admitting: Family Medicine

## 2016-12-03 DIAGNOSIS — G8929 Other chronic pain: Secondary | ICD-10-CM

## 2016-12-03 DIAGNOSIS — M5442 Lumbago with sciatica, left side: Secondary | ICD-10-CM

## 2016-12-03 MED ORDER — METHOCARBAMOL 500 MG PO TABS: 500.0000 mg | ORAL_TABLET | Freq: Two times a day (BID) | ORAL | 0 refills | Status: DC

## 2016-12-03 MED ORDER — MELOXICAM 7.5 MG PO TABS: 7.5000 mg | ORAL_TABLET | Freq: Every day | ORAL | 0 refills | Status: DC

## 2016-12-03 NOTE — Discharge Instructions (Signed)
I recommend you establish with a primary care provider who will be able to follow your care. For this visit you have been given a muscle relaxant and an antiinflammatory medicine for pain. Should symptoms fail to improve follow up with a primary care provider or return to clinic.
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
## 2016-12-03 NOTE — ED Triage Notes (Signed)
Pt here for lower back pain with radiation to left leg. sts taking pain meds and helping some. sts reoccurrence episodes but needs something stringer for pain.

## 2018-01-08 ENCOUNTER — Encounter (HOSPITAL_COMMUNITY): Payer: Self-pay | Admitting: Family Medicine

## 2018-01-08 ENCOUNTER — Ambulatory Visit (HOSPITAL_COMMUNITY)
Admission: EM | Admit: 2018-01-08 | Discharge: 2018-01-08 | Disposition: A | Discharge status: Home / Self Care | Payer: Self-pay | Attending: Family Medicine | Admitting: Family Medicine

## 2018-01-08 DIAGNOSIS — M25552 Pain in left hip: Secondary | ICD-10-CM

## 2018-01-08 DIAGNOSIS — R319 Hematuria, unspecified: Secondary | ICD-10-CM

## 2018-01-08 DIAGNOSIS — R300 Dysuria: Secondary | ICD-10-CM

## 2018-01-08 LAB — POCT URINALYSIS DIP (DEVICE)
BILIRUBIN URINE: NEGATIVE
GLUCOSE, UA: NEGATIVE mg/dL
Ketones, ur: NEGATIVE mg/dL
LEUKOCYTES UA: NEGATIVE
Nitrite: NEGATIVE
Protein, ur: 30 mg/dL — AB
Specific Gravity, Urine: >=1.03 (ref 1.005–1.030)
UROBILINOGEN UA: 0.2 mg/dL (ref 0.0–1.0)
pH: 6 (ref 5.0–8.0)

## 2018-01-08 MED ORDER — TRAMADOL HCL 50 MG PO TABS: 50.0000 mg | ORAL_TABLET | Freq: Four times a day (QID) | ORAL | 0 refills | Status: DC | PRN

## 2018-01-08 NOTE — Discharge Instructions (Signed)
We have sent testing for sexually transmitted infections.  We have also sent a urine culture to see if any bacteria show up from your urine sample.  We will notify you of any positive results once they are received. If required, we will prescribe any medications you might need.  Be aware, pain medications may cause drowsiness. Please do not drive, operate heavy machinery or make important decisions while on this medication, it can cloud your judgement. You may continue to use ibuprofen as needed.

## 2018-01-08 NOTE — ED Provider Notes (Signed)
  [facility]    ASSESSMENT & PLAN:  1. Left hip pain   2. Hematuria, unspecified type   3. Dysuria    Meds ordered this encounter  Medications  . traMADol (ULTRAM) 50 MG tablet    Sig: Take 1 tablet (50 mg total) by mouth every 6 (six) hours as needed.    Dispense:  15 tablet    Refill:  0   Urine culture and urine cytology sent. Will notify patient of any positive results when available. If hematuria persists discussed that he may need to see a urologist as he is a current smoker.  Outlined signs and symptoms indicating need for more acute intervention. Patient verbalized understanding. After Visit Summary given.  SUBJECTIVE:  Julian Pugh is a 32 y.o. male who complains of hematuria noticed 2 days ago. Initially visible. Hasn't noticed yesterday or today. No flank pain, fever, chills, abnormal penile discharge. No recent injuries. Normal PO intake. No abdominal pain. No self treatment. Ambulatory without difficulty.  Social History   Tobacco Use  Smoking Status Current Every Day Smoker  . Packs/day: 0.50  . Years: 11.00  . Pack years: 5.50  . Types: Cigarettes  Smokeless Tobacco Never Used   Also reports "a slipped disk" in my back for several years. Radiates to L hip with intermittent exacerbations of pain. Currently feeling more pain for the past 1-2 weeks. Ibuprofen 800mg  with minimal and transient relief.  ROS: As in HPI.  OBJECTIVE:  Vitals:   01/08/18 1556  BP: 115/71  Pulse: 71  Resp: 18  Temp: 98.4 F (36.9 C)  SpO2: 100%   Appears well, in no apparent distress. Abdomen is soft without tenderness, guarding, mass, rebound or organomegaly. No CVA tenderness or inguinal adenopathy noted. GU exam declined. Bilateral hip exams unremarkable except for reported discomfort of L hip with exam.  Labs Reviewed  POCT URINALYSIS DIP (DEVICE) - Abnormal; Notable for the following components:      Result Value   Hgb urine dipstick LARGE (*)    Protein, ur 30 (*)    All other components within normal limits   No Known Allergies  Past Medical History:  Diagnosis Date  . Bipolar 1 disorder (HCC)   . Depression   . Hypertension    Social History   Socioeconomic History  . Marital status: Single    Spouse name: Not on file  . Number of children: Not on file  . Years of education: Not on file  . Highest education level: Not on file  Social Needs  . Financial resource strain: Not on file  . Food insecurity - worry: Not on file  . Food insecurity - inability: Not on file  . Transportation needs - medical: Not on file  . Transportation needs - non-medical: Not on file  Occupational History  . Not on file  Tobacco Use  . Smoking status: Current Every Day Smoker    Packs/day: 0.50    Years: 11.00    Pack years: 5.50    Types: Cigarettes  . Smokeless tobacco: Never Used  Substance and Sexual Activity  . Alcohol use: Yes  . Drug use: Yes    Types: Marijuana  . Sexual activity: No  Other Topics Concern  . Not on file  Social History Narrative  . Not on file   History reviewed. No pertinent family history.     Mardella LaymanHagler, Destanee Bedonie, MD 01/12/18 [phone]

## 2018-01-08 NOTE — ED Triage Notes (Signed)
Pt here for left hip pain and left leg pain that he reports is due to herniated disc. He has been taking 800 mg ibuprofen and tylenol. sts also blood in urine x 2 days.

## 2019-06-11 DIAGNOSIS — F1221 Cannabis dependence, in remission: Secondary | ICD-10-CM | POA: Insufficient documentation

## 2019-07-10 ENCOUNTER — Other Ambulatory Visit: Payer: Self-pay

## 2019-07-10 ENCOUNTER — Emergency Department (HOSPITAL_COMMUNITY): Payer: Self-pay

## 2019-07-10 ENCOUNTER — Telehealth: Payer: Self-pay | Admitting: *Deleted

## 2019-07-10 ENCOUNTER — Emergency Department (HOSPITAL_COMMUNITY)
Admission: EM | Admit: 2019-07-10 | Discharge: 2019-07-10 | Disposition: A | Discharge status: Home / Self Care | Payer: Self-pay | Attending: Emergency Medicine | Admitting: Emergency Medicine

## 2019-07-10 ENCOUNTER — Encounter (HOSPITAL_COMMUNITY): Payer: Self-pay | Admitting: Emergency Medicine

## 2019-07-10 DIAGNOSIS — W3400XA Accidental discharge from unspecified firearms or gun, initial encounter: Secondary | ICD-10-CM

## 2019-07-10 DIAGNOSIS — S0181XA Laceration without foreign body of other part of head, initial encounter: Secondary | ICD-10-CM | POA: Insufficient documentation

## 2019-07-10 DIAGNOSIS — Y929 Unspecified place or not applicable: Secondary | ICD-10-CM | POA: Insufficient documentation

## 2019-07-10 DIAGNOSIS — F17210 Nicotine dependence, cigarettes, uncomplicated: Secondary | ICD-10-CM | POA: Insufficient documentation

## 2019-07-10 DIAGNOSIS — S0990XA Unspecified injury of head, initial encounter: Secondary | ICD-10-CM

## 2019-07-10 DIAGNOSIS — F319 Bipolar disorder, unspecified: Secondary | ICD-10-CM | POA: Insufficient documentation

## 2019-07-10 DIAGNOSIS — Y939 Activity, unspecified: Secondary | ICD-10-CM | POA: Insufficient documentation

## 2019-07-10 DIAGNOSIS — Y999 Unspecified external cause status: Secondary | ICD-10-CM | POA: Insufficient documentation

## 2019-07-10 DIAGNOSIS — S0083XA Contusion of other part of head, initial encounter: Secondary | ICD-10-CM | POA: Insufficient documentation

## 2019-07-10 DIAGNOSIS — Z23 Encounter for immunization: Secondary | ICD-10-CM | POA: Insufficient documentation

## 2019-07-10 DIAGNOSIS — S0101XA Laceration without foreign body of scalp, initial encounter: Secondary | ICD-10-CM | POA: Insufficient documentation

## 2019-07-10 DIAGNOSIS — S81842A Puncture wound with foreign body, left lower leg, initial encounter: Secondary | ICD-10-CM | POA: Insufficient documentation

## 2019-07-10 DIAGNOSIS — I10 Essential (primary) hypertension: Secondary | ICD-10-CM | POA: Insufficient documentation

## 2019-07-10 LAB — CBC WITH DIFFERENTIAL/PLATELET
Abs Immature Granulocytes: 0.03 10*3/uL (ref 0.00–0.07)
Basophils Absolute: 0.1 10*3/uL (ref 0.0–0.1)
Basophils Relative: 1 %
Eosinophils Absolute: 0.3 10*3/uL (ref 0.0–0.5)
Eosinophils Relative: 4 %
HCT: 40.3 % (ref 39.0–52.0)
Hemoglobin: 13.2 g/dL (ref 13.0–17.0)
Immature Granulocytes: 0 %
Lymphocytes Relative: 48 %
Lymphs Abs: 3.6 10*3/uL (ref 0.7–4.0)
MCH: 32.1 pg (ref 26.0–34.0)
MCHC: 32.8 g/dL (ref 30.0–36.0)
MCV: 98.1 fL (ref 80.0–100.0)
Monocytes Absolute: 0.7 10*3/uL (ref 0.1–1.0)
Monocytes Relative: 9 %
Neutro Abs: 2.9 10*3/uL (ref 1.7–7.7)
Neutrophils Relative %: 38 %
Platelets: 256 10*3/uL (ref 150–400)
RBC: 4.11 MIL/uL — ABNORMAL LOW (ref 4.22–5.81)
RDW: 12.2 % (ref 11.5–15.5)
WBC: 7.5 10*3/uL (ref 4.0–10.5)
nRBC: 0 % (ref 0.0–0.2)

## 2019-07-10 LAB — BASIC METABOLIC PANEL
Anion gap: 18 — ABNORMAL HIGH (ref 5–15)
BUN: 7 mg/dL (ref 6–20)
CO2: 17 mmol/L — ABNORMAL LOW (ref 22–32)
Calcium: 8.8 mg/dL — ABNORMAL LOW (ref 8.9–10.3)
Chloride: 103 mmol/L (ref 98–111)
Creatinine, Ser: 1.16 mg/dL (ref 0.61–1.24)
GFR calc Af Amer: >60 mL/min (ref >60)
GFR calc non Af Amer: >60 mL/min (ref >60)
Glucose, Bld: 133 mg/dL — ABNORMAL HIGH (ref 70–99)
Potassium: 3.3 mmol/L — ABNORMAL LOW (ref 3.5–5.1)
Sodium: 138 mmol/L (ref 135–145)

## 2019-07-10 LAB — ETHANOL: Alcohol, Ethyl (B): 124 mg/dL — ABNORMAL HIGH (ref <10)

## 2019-07-10 LAB — PROTIME-INR
INR: 1.1 (ref 0.8–1.2)
Prothrombin Time: 13.7 seconds (ref 11.4–15.2)

## 2019-07-10 MED ORDER — CEFAZOLIN SODIUM-DEXTROSE 2-4 GM/100ML-% IV SOLN
2.0000 g | Freq: Once | INTRAVENOUS | Status: AC
  Administered 2019-07-10: 2 g via INTRAVENOUS
  Filled 2019-07-10: qty 100

## 2019-07-10 MED ORDER — SODIUM CHLORIDE 0.9 % IV SOLN
INTRAVENOUS | Status: AC | PRN
  Administered 2019-07-10: 1000 mL via INTRAVENOUS

## 2019-07-10 MED ORDER — HYDROCODONE-ACETAMINOPHEN 5-325 MG PO TABS: 1.0000 | ORAL_TABLET | ORAL | 0 refills | Status: DC | PRN

## 2019-07-10 MED ORDER — TETANUS-DIPHTH-ACELL PERTUSSIS 5-2.5-18.5 LF-MCG/0.5 IM SUSP
0.5000 mL | Freq: Once | INTRAMUSCULAR | Status: AC
  Administered 2019-07-10: 0.5 mL via INTRAMUSCULAR
  Filled 2019-07-10: qty 0.5

## 2019-07-10 MED ORDER — FENTANYL CITRATE (PF) 100 MCG/2ML IJ SOLN
100.0000 ug | Freq: Once | INTRAMUSCULAR | Status: AC
  Administered 2019-07-10: 100 ug via INTRAVENOUS
  Filled 2019-07-10: qty 2
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
## 2019-07-10 NOTE — Discharge Instructions (Addendum)
If your pain and swelling of the leg increase, you start to feel like the foot is cold or turning blue, you have numbness and tingling of the leg then return to the ER.  Otherwise call Dr. Doreatha Martin for follow-up this week.  Use the crutches at all times, do not try to walk on your leg.

## 2019-07-10 NOTE — ED Notes (Signed)
Ortho tech paged for cam walker and crutches 

## 2019-07-10 NOTE — Progress Notes (Signed)
Orthopedic Tech Progress Note Patient Details:  Julian Pugh [birth year] [id number]  Ortho Devices Type of Ortho Device: CAM walker, Crutches Ortho Device/Splint Location: Right Ortho Device/Splint Interventions: Application   Post Interventions Patient Tolerated: Well Instructions Provided: Care of device   [place] Pink 07/10/2019, 8:38 AM

## 2019-07-10 NOTE — ED Notes (Signed)
Ortho tech in room 

## 2019-07-10 NOTE — ED Notes (Signed)
Pt verbalized understanding of d/c instructions and has no further questions, VSS, NAD. Pt d/c home with family.
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
## 2019-07-12 ENCOUNTER — Telehealth: Payer: Self-pay | Admitting: *Deleted
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
## 2019-09-06 ENCOUNTER — Other Ambulatory Visit: Payer: Self-pay

## 2019-09-06 ENCOUNTER — Encounter (HOSPITAL_COMMUNITY): Payer: Self-pay

## 2019-09-06 ENCOUNTER — Ambulatory Visit (HOSPITAL_COMMUNITY)
Admission: EM | Admit: 2019-09-06 | Discharge: 2019-09-06 | Disposition: A | Discharge status: Home / Self Care | Payer: Medicaid Other | Attending: Family Medicine | Admitting: Family Medicine

## 2019-09-06 DIAGNOSIS — Z113 Encounter for screening for infections with a predominantly sexual mode of transmission: Secondary | ICD-10-CM | POA: Insufficient documentation

## 2019-09-06 DIAGNOSIS — R319 Hematuria, unspecified: Secondary | ICD-10-CM | POA: Insufficient documentation

## 2019-09-06 DIAGNOSIS — M5432 Sciatica, left side: Secondary | ICD-10-CM | POA: Insufficient documentation

## 2019-09-06 DIAGNOSIS — Z5189 Encounter for other specified aftercare: Secondary | ICD-10-CM | POA: Insufficient documentation

## 2019-09-06 LAB — POCT URINALYSIS DIP (DEVICE)
Bilirubin Urine: NEGATIVE
Glucose, UA: NEGATIVE mg/dL
Ketones, ur: NEGATIVE mg/dL
Leukocytes,Ua: NEGATIVE
Nitrite: NEGATIVE
Protein, ur: 100 mg/dL — AB
Specific Gravity, Urine: >=1.03 (ref 1.005–1.030)
Urobilinogen, UA: 0.2 mg/dL (ref 0.0–1.0)
pH: 6 (ref 5.0–8.0)

## 2019-09-06 MED ORDER — KETOROLAC TROMETHAMINE 60 MG/2ML IM SOLN: 60.0000 mg | Freq: Once | INTRAMUSCULAR | Status: DC

## 2019-09-06 MED ORDER — CYCLOBENZAPRINE HCL 10 MG PO TABS: 10.0000 mg | ORAL_TABLET | Freq: Two times a day (BID) | ORAL | 0 refills | Status: DC | PRN

## 2019-09-06 MED ORDER — KETOROLAC TROMETHAMINE 60 MG/2ML IM SOLN
INTRAMUSCULAR | Status: AC
  Filled 2019-09-06: qty 2

## 2019-09-06 MED ORDER — PREDNISONE 10 MG (21) PO TBPK: ORAL_TABLET | ORAL | 0 refills | Status: DC
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
## 2019-09-06 NOTE — ED Triage Notes (Signed)
Pt states he needs something for his back pain.  Pt states he wants to be tested for STDs. Pt states he saw blood in his urine. This started last night.

## 2019-09-06 NOTE — Discharge Instructions (Addendum)
Treating you for sciatic nerve pain Take the prednisone taper as prescribed.  Take with food. Flexeril for muscle relaxant.  Be aware this may make you drowsy. Toradol given here for pain and inflammation Gentle stretching could help along with heat and massage. Follow up as needed for continued or worsening symptoms

## 2019-09-07 LAB — CYTOLOGY, (ORAL, ANAL, URETHRAL) ANCILLARY ONLY
Chlamydia: NEGATIVE
Neisseria Gonorrhea: NEGATIVE
Trichomonas: NEGATIVE

## 2019-09-07 LAB — URINE CULTURE: Culture: NO GROWTH

## 2019-12-28 DIAGNOSIS — F17200 Nicotine dependence, unspecified, uncomplicated: Secondary | ICD-10-CM | POA: Insufficient documentation

## 2019-12-28 DIAGNOSIS — F32A Depression, unspecified: Secondary | ICD-10-CM | POA: Insufficient documentation

## 2019-12-28 DIAGNOSIS — F4310 Post-traumatic stress disorder, unspecified: Secondary | ICD-10-CM | POA: Insufficient documentation

## 2019-12-28 DIAGNOSIS — F6381 Intermittent explosive disorder: Secondary | ICD-10-CM | POA: Insufficient documentation

## 2019-12-28 DIAGNOSIS — F602 Antisocial personality disorder: Secondary | ICD-10-CM | POA: Insufficient documentation

## 2020-05-16 ENCOUNTER — Ambulatory Visit (HOSPITAL_COMMUNITY): Payer: Self-pay

## 2020-06-03 ENCOUNTER — Other Ambulatory Visit: Payer: Self-pay

## 2020-06-03 ENCOUNTER — Ambulatory Visit (HOSPITAL_COMMUNITY)
Admission: EM | Admit: 2020-06-03 | Discharge: 2020-06-03 | Disposition: A | Discharge status: Home / Self Care | Payer: Medicaid Other | Attending: Family Medicine | Admitting: Family Medicine

## 2020-06-03 ENCOUNTER — Encounter (HOSPITAL_COMMUNITY): Payer: Self-pay

## 2020-06-03 ENCOUNTER — Ambulatory Visit (HOSPITAL_COMMUNITY): Payer: Self-pay

## 2020-06-03 DIAGNOSIS — Z113 Encounter for screening for infections with a predominantly sexual mode of transmission: Secondary | ICD-10-CM | POA: Insufficient documentation

## 2020-06-03 DIAGNOSIS — S161XXA Strain of muscle, fascia and tendon at neck level, initial encounter: Secondary | ICD-10-CM | POA: Insufficient documentation

## 2020-06-03 MED ORDER — CYCLOBENZAPRINE HCL 10 MG PO TABS: ORAL_TABLET | ORAL | 0 refills | Status: DC

## 2020-06-03 MED ORDER — DICLOFENAC SODIUM 75 MG PO TBEC: 75.0000 mg | DELAYED_RELEASE_TABLET | Freq: Two times a day (BID) | ORAL | 0 refills | Status: DC
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
## 2020-06-03 NOTE — Discharge Instructions (Addendum)
We have sent testing for sexually transmitted infections. We will notify you of any positive results once they are received. If required, we will prescribe any medications you might need.  Please refrain from all sexual activity for at least the next seven days.  
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
## 2020-06-06 LAB — CYTOLOGY, (ORAL, ANAL, URETHRAL) ANCILLARY ONLY
Chlamydia: NEGATIVE
Comment: NEGATIVE
Comment: NEGATIVE
Comment: NORMAL
Neisseria Gonorrhea: NEGATIVE
Trichomonas: NEGATIVE

## 2020-07-20 ENCOUNTER — Ambulatory Visit (HOSPITAL_COMMUNITY)
Admission: EM | Admit: 2020-07-20 | Discharge: 2020-07-20 | Disposition: A | Discharge status: Home / Self Care | Payer: Self-pay | Attending: Family Medicine | Admitting: Family Medicine

## 2020-07-20 ENCOUNTER — Other Ambulatory Visit: Payer: Self-pay

## 2020-07-20 ENCOUNTER — Encounter (HOSPITAL_COMMUNITY): Payer: Self-pay

## 2020-07-20 ENCOUNTER — Ambulatory Visit (HOSPITAL_COMMUNITY): Payer: Self-pay

## 2020-07-20 DIAGNOSIS — S161XXA Strain of muscle, fascia and tendon at neck level, initial encounter: Secondary | ICD-10-CM

## 2020-07-20 MED ORDER — CYCLOBENZAPRINE HCL 10 MG PO TABS: ORAL_TABLET | ORAL | 0 refills | Status: DC

## 2020-07-20 MED ORDER — DICLOFENAC SODIUM 75 MG PO TBEC: 75.0000 mg | DELAYED_RELEASE_TABLET | Freq: Two times a day (BID) | ORAL | 0 refills | Status: DC
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
## 2020-08-14 ENCOUNTER — Ambulatory Visit (HOSPITAL_COMMUNITY): Admit: 2020-08-14 | Discharge status: Against Medical Advice | Payer: Medicaid Other

## 2020-08-14 ENCOUNTER — Ambulatory Visit (HOSPITAL_COMMUNITY): Payer: Self-pay

## 2020-08-15 ENCOUNTER — Ambulatory Visit (HOSPITAL_COMMUNITY): Admit: 2020-08-15 | Discharge status: Against Medical Advice | Payer: Medicaid Other

## 2020-08-16 ENCOUNTER — Encounter (HOSPITAL_COMMUNITY): Payer: Self-pay

## 2020-08-16 ENCOUNTER — Ambulatory Visit (HOSPITAL_COMMUNITY): Payer: Self-pay

## 2020-08-16 ENCOUNTER — Other Ambulatory Visit: Payer: Self-pay

## 2020-08-16 ENCOUNTER — Ambulatory Visit (HOSPITAL_COMMUNITY)
Admission: RE | Admit: 2020-08-16 | Discharge: 2020-08-16 | Disposition: A | Discharge status: Home / Self Care | Payer: Medicaid Other | Source: Ambulatory Visit | Attending: Family Medicine | Admitting: Family Medicine

## 2020-08-16 VITALS — BP 118/83 | HR 65 | Temp 98.8°F | Resp 16

## 2020-08-16 DIAGNOSIS — L309 Dermatitis, unspecified: Secondary | ICD-10-CM | POA: Diagnosis not present

## 2020-08-16 DIAGNOSIS — Z113 Encounter for screening for infections with a predominantly sexual mode of transmission: Secondary | ICD-10-CM

## 2020-08-16 HISTORY — DX: Dermatitis, unspecified: L30.9

## 2020-08-16 LAB — HIV ANTIBODY (ROUTINE TESTING W REFLEX): HIV Screen 4th Generation wRfx: NONREACTIVE

## 2020-08-16 MED ORDER — TRIAMCINOLONE ACETONIDE 0.1 % EX CREA: 1.0000 "application " | TOPICAL_CREAM | Freq: Two times a day (BID) | CUTANEOUS | 1 refills | Status: DC

## 2020-08-16 NOTE — Discharge Instructions (Addendum)
We have sent testing for sexually transmitted infections. We will notify you of any positive results once they are received. If required, we will prescribe any medications you might need.  Please refrain from all sexual activity for at least the next seven days.  

## 2020-08-16 NOTE — ED Provider Notes (Signed)
[facility]   [id number] 08/16/20 Arrival Time: 1540  ASSESSMENT & PLAN:  1. Eczema, unspecified type   2. Screening for STDs (sexually transmitted diseases)       Discharge Instructions     We have sent testing for sexually transmitted infections. We will notify you of any positive results once they are received. If required, we will prescribe any medications you might need.  Please refrain from all sexual activity for at least the next seven days.    No empiric treatment.  Pending: Labs Reviewed  RPR  HIV ANTIBODY (ROUTINE TESTING W REFLEX)  CYTOLOGY, (ORAL, ANAL, URETHRAL) ANCILLARY ONLY    Will notify of any positive results. Instructed to refrain from sexual activity for at least seven days.  Reviewed expectations re: course of current medical issues. Questions answered. Outlined signs and symptoms indicating need for more acute intervention. Patient verbalized understanding. After Visit Summary given.   SUBJECTIVE:  Julian Pugh is a 34 y.o. male who requests refill of triamcinolone cream for eczema; has been out; recent exacerbation; mostly upper extremities; itching.  Also requests STD screening. No symptoms. Denies: urinary frequency, dysuria and gross hematuria. Afebrile. No abdominal or pelvic pain. No n/v. No rashes or lesions. Reports that he is sexually active with two male partners.   OBJECTIVE:  Vitals:   08/16/20 1606  BP: 118/83  Pulse: 65  Resp: 16  Temp: 98.8 F (37.1 C)  TempSrc: Oral  SpO2: 98%     General appearance: alert, cooperative, appears stated age and no distress Throat: lips, mucosa, and tongue normal; teeth and gums normal Lungs: unlabored respirations; speaks full sentences without difficulty Back: no CVA tenderness; FROM at waist Abdomen: soft, non-tender GU: normal appearing genitalia Skin: warm and dry; UE with flexural eczema; no signs of skin infection Psychological: alert and cooperative; normal  mood and affect.    Labs Reviewed  RPR  HIV ANTIBODY (ROUTINE TESTING W REFLEX)  CYTOLOGY, (ORAL, ANAL, URETHRAL) ANCILLARY ONLY    No Known Allergies  Past Medical History:  Diagnosis Date   Bipolar 1 disorder (HCC)    Depression    Eczema    Hypertension    Family History  Problem Relation Age of Onset   Healthy Mother    Healthy Father    Social History   Socioeconomic History   Marital status: Single    Spouse name: Not on file   Number of children: Not on file   Years of education: Not on file   Highest education level: Not on file  Occupational History   Not on file  Tobacco Use   Smoking status: Current Every Day Smoker    Packs/day: 0.50    Years: 11.00    Pack years: 5.50    Types: Cigarettes   Smokeless tobacco: Never Used  Vaping Use   Vaping Use: Never used  Substance and Sexual Activity   Alcohol use: Yes   Drug use: Yes    Types: Marijuana   Sexual activity: Never  Other Topics Concern   Not on file  Social History Narrative   Not on file   Social Determinants of Health   Financial Resource Strain:    Difficulty of Paying Living Expenses: Not on file  Food Insecurity:    Worried About Programme researcher, broadcasting/film/video in the Last Year: Not on file   [employer] of Food in the Last Year: Not on file  Transportation Needs:    Lack of Transportation (  Medical): Not on file   Lack of Transportation (Non-Medical): Not on file  Physical Activity:    Days of Exercise per Week: Not on file   Minutes of Exercise per Session: Not on file  Stress:    Feeling of Stress : Not on file  Social Connections:    Frequency of Communication with Friends and Family: Not on file   Frequency of Social Gatherings with Friends and Family: Not on file   Attends Religious Services: Not on file   Active Member of Clubs or Organizations: Not on file   Attends Banker Meetings: Not on file   Marital Status: Not on file  Intimate  Partner Violence:    Fear of Current or Ex-Partner: Not on file   Emotionally Abused: Not on file   Physically Abused: Not on file   Sexually Abused: Not on file          [place], MD 08/16/20 [phone]

## 2020-08-16 NOTE — ED Triage Notes (Signed)
Pt states that he has had an eczema flare up on his arms and behind his knees. He also states he has a rash on near his genitals and requests std testing.

## 2020-08-17 LAB — CYTOLOGY, (ORAL, ANAL, URETHRAL) ANCILLARY ONLY
Chlamydia: POSITIVE — AB
Comment: NEGATIVE
Comment: NEGATIVE
Comment: NORMAL
Neisseria Gonorrhea: NEGATIVE
Trichomonas: NEGATIVE

## 2020-08-17 LAB — RPR: RPR Ser Ql: NONREACTIVE

## 2020-08-18 ENCOUNTER — Telehealth (HOSPITAL_COMMUNITY): Payer: Self-pay | Admitting: Emergency Medicine

## 2020-08-18 MED ORDER — AZITHROMYCIN 250 MG PO TABS: 1000.0000 mg | ORAL_TABLET | Freq: Once | ORAL | 0 refills | Status: AC

## 2020-12-28 ENCOUNTER — Encounter (HOSPITAL_COMMUNITY): Payer: Self-pay

## 2020-12-28 ENCOUNTER — Other Ambulatory Visit: Payer: Self-pay

## 2020-12-28 ENCOUNTER — Ambulatory Visit (HOSPITAL_COMMUNITY)
Admission: RE | Admit: 2020-12-28 | Discharge: 2020-12-28 | Disposition: A | Discharge status: Home / Self Care | Payer: 59 | Source: Ambulatory Visit | Attending: Family Medicine | Admitting: Family Medicine

## 2020-12-28 VITALS — BP 129/71 | HR 71 | Temp 98.2°F | Resp 19

## 2020-12-28 DIAGNOSIS — R21 Rash and other nonspecific skin eruption: Secondary | ICD-10-CM | POA: Diagnosis not present

## 2020-12-28 MED ORDER — VALACYCLOVIR HCL 1 G PO TABS: 1000.0000 mg | ORAL_TABLET | Freq: Two times a day (BID) | ORAL | 0 refills | Status: DC

## 2020-12-28 NOTE — ED Triage Notes (Signed)
Pt in with c/o bumps on penis that he noticed a few days ago.states the bumps looked like blackheads. Also states he has been having intercourse with his girlfriend and she has a yeast infection  Denies penile discharge or burning when urinating

## 2020-12-28 NOTE — ED Provider Notes (Signed)
[facility]    CSN: [id number] Arrival date & time: 12/28/20  1339      History   Chief Complaint Chief Complaint  Patient presents with  . rash on penis    HPI Julian Pugh is a 35 y.o. male.   HPI  Patient is here for a rash on his penis.  He states is been present for for 5 days.  He states it does not hurt.  No drainage.  No penile discharge.  No dysuria  Past Medical History:  Diagnosis Date  . Bipolar 1 disorder (HCC)   . Depression   . Eczema   . Hypertension     Patient Active Problem List   Diagnosis Date Noted  . Atopic eczema 10/30/2011    History reviewed. No pertinent surgical history.     Home Medications    Prior to Admission medications   Medication Sig Start Date End Date Taking? Authorizing Provider  valACYclovir (VALTREX) 1000 MG tablet Take 1 tablet (1,000 mg total) by mouth 2 (two) times daily. 12/28/20  Yes Eustace Moore, MD  gabapentin (NEURONTIN) 300 MG capsule Take 300 mg by mouth 3 (three) times daily. 11/16/20   [provider]  tamsulosin (FLOMAX) 0.4 MG CAPS capsule Take 0.4 mg by mouth daily. 10/05/20   [provider]  triamcinolone cream (KENALOG) 0.1 % Apply 1 application topically 2 (two) times daily. 08/16/20   Mardella Layman, MD    Family History Family History  Problem Relation Age of Onset  . Healthy Mother   . Healthy Father     Social History Social History   Tobacco Use  . Smoking status: Current Every Day Smoker    Packs/day: 0.50    Years: 11.00    Pack years: 5.50    Types: Cigarettes  . Smokeless tobacco: Never Used  Vaping Use  . Vaping Use: Never used  Substance Use Topics  . Alcohol use: Yes  . Drug use: Yes    Types: Marijuana     Allergies   Patient has no known allergies.   Review of Systems Review of Systems See HPI  Physical Exam Triage Vital Signs ED Triage Vitals  Enc Vitals Group     BP 12/28/20 1418 129/71     Pulse Rate 12/28/20 1418 71      Resp 12/28/20 1418 19     Temp 12/28/20 1418 98.2 F (36.8 C)     Temp src --      SpO2 12/28/20 1418 100 %     Weight --      Height --      Head Circumference --      Peak Flow --      Pain Score 12/28/20 1416 0     Pain Loc --      Pain Edu? --      Excl. in GC? --    No data found.  Updated Vital Signs BP 129/71   Pulse 71   Temp 98.2 F (36.8 C)   Resp 19   SpO2 100%      Physical Exam Constitutional:      General: He is not in acute distress.    Appearance: He is well-developed and well-nourished.  HENT:     Head: Normocephalic and atraumatic.     Mouth/Throat:     Mouth: Oropharynx is clear and moist.  Eyes:     Conjunctiva/sclera: Conjunctivae normal.     Pupils: Pupils are equal,  round, and reactive to light.  Cardiovascular:     Rate and Rhythm: Normal rate.  Pulmonary:     Effort: Pulmonary effort is normal. No respiratory distress.  Abdominal:     General: There is no distension.     Palpations: Abdomen is soft.  Genitourinary:    Comments: Clusters of vesicles are identified on the shaft of the penis. Musculoskeletal:        General: No edema. Normal range of motion.     Cervical back: Normal range of motion.  Skin:    General: Skin is warm and dry.  Neurological:     Mental Status: He is alert.  Psychiatric:        Mood and Affect: Mood normal.      UC Treatments / Results  Labs (all labs ordered are listed, but only abnormal results are displayed) Labs Reviewed  HSV CULTURE AND TYPING    EKG   Radiology No results found.  Procedures Procedures (including critical care time)  Medications Ordered in UC Medications - No data to display  Initial Impression / Assessment and Plan / UC Course  I have reviewed the triage vital signs and the nursing notes.  Pertinent labs & imaging results that were available during my care of the patient were reviewed by me and considered in my medical decision making (see chart for  details).     STI testing was performed.  I discussed with patient this is likely herpes.  He needs to discuss this with his pregnant girlfriend Final Clinical Impressions(s) / UC Diagnoses   Final diagnoses:  Rash of penis     Discharge Instructions     Take the anti viral medicine 2 x a day for 5 days Check your test results on My Chart Avoid sexual relations for 7 days   ED Prescriptions    Medication Sig Dispense Auth. Provider   valACYclovir (VALTREX) 1000 MG tablet Take 1 tablet (1,000 mg total) by mouth 2 (two) times daily. 10 tablet Eustace Moore, MD     PDMP not reviewed this encounter.   Eustace Moore, MD 12/28/20 2012

## 2020-12-28 NOTE — Discharge Instructions (Addendum)
Take the anti viral medicine 2 x a day for 5 days Check your test results on My Chart Avoid sexual relations for 7 days

## 2020-12-30 LAB — HSV CULTURE AND TYPING

## 2021-01-02 ENCOUNTER — Other Ambulatory Visit: Payer: Self-pay

## 2021-01-02 ENCOUNTER — Ambulatory Visit (HOSPITAL_COMMUNITY)
Admission: RE | Admit: 2021-01-02 | Discharge: 2021-01-02 | Disposition: A | Discharge status: Home / Self Care | Payer: 59 | Source: Ambulatory Visit | Attending: Urgent Care | Admitting: Urgent Care

## 2021-01-02 ENCOUNTER — Encounter (HOSPITAL_COMMUNITY): Payer: Self-pay

## 2021-01-02 VITALS — BP 130/78 | HR 72 | Temp 98.7°F | Resp 16

## 2021-01-02 DIAGNOSIS — R21 Rash and other nonspecific skin eruption: Secondary | ICD-10-CM | POA: Diagnosis not present

## 2021-01-02 LAB — HIV ANTIBODY (ROUTINE TESTING W REFLEX): HIV Screen 4th Generation wRfx: NONREACTIVE

## 2021-01-02 MED ORDER — VALACYCLOVIR HCL 1 G PO TABS: ORAL_TABLET | ORAL | 5 refills | Status: DC

## 2021-01-02 NOTE — Discharge Instructions (Signed)
You tested positive for genital herpes virus. Since you are in a monogamous relationship and have had this rash previously it is likely that you have had the genital herpes infection before and were only recently tested appropriately. You can take valacyclovir for future outbreaks as needed. But if you start to have multiple outbreaks a year, then come back here or to your primary care provider to consider starting a suppression treatment using valacyclovir. Otherwise you can do your refills as needed for up to 1 year.

## 2021-01-02 NOTE — ED Provider Notes (Signed)
[facility]   MRN: [id number] DOB: 24-Jun-1986  Subjective:   Julian Pugh is a 35 y.o. male presenting for retest of genital herpes. Patient was last seen on 12/28/2020. At the time he was being seen for painful blistery rash of of the left side of his penis. He was seen by Dr. Delton See who performed a genital herpes swab by unroofing one of the vesicles and sending out for testing. His results were positive. Unfortunately, patient is very upset by this as he is married and his wife is pregnant. They are having a lot of trouble with this diagnosis and wants to make sure he gets rechecked. He would also like testing for HIV, syphilis, gonorrhea, chlamydia and trichomonas. Denies fever, nausea, vomiting, pelvic pain, penile discharge, testicular pain. Patient does admit that he has had blood tests for HSV but has never been positive. Reports that he has never been tested as Dr. Delton See tested him over the genital area using swab.  No current facility-administered medications for this encounter.  Current Outpatient Medications:  .  gabapentin (NEURONTIN) 300 MG capsule, Take 300 mg by mouth 3 (three) times daily., Disp: , Rfl:  .  tamsulosin (FLOMAX) 0.4 MG CAPS capsule, Take 0.4 mg by mouth daily., Disp: , Rfl:  .  triamcinolone cream (KENALOG) 0.1 %, Apply 1 application topically 2 (two) times daily., Disp: 453.6 g, Rfl: 1 .  valACYclovir (VALTREX) 1000 MG tablet, Take 1 tablet (1,000 mg total) by mouth 2 (two) times daily., Disp: 10 tablet, Rfl: 0   No Known Allergies  Past Medical History:  Diagnosis Date  . Bipolar 1 disorder (HCC)   . Depression   . Eczema   . Hypertension      History reviewed. No pertinent surgical history.  Family History  Problem Relation Age of Onset  . Healthy Mother   . Healthy Father     Social History   Tobacco Use  . Smoking status: Current Every Day Smoker    Packs/day: 0.50    Years: 11.00    Pack years: 5.50    Types:  Cigarettes  . Smokeless tobacco: Never Used  Vaping Use  . Vaping Use: Never used  Substance Use Topics  . Alcohol use: Yes  . Drug use: Yes    Types: Marijuana    ROS   Objective:   Vitals: BP 130/78 (BP Location: Left Arm)   Pulse 72   Temp 98.7 F (37.1 C) (Oral)   Resp 16   SpO2 96%   Physical Exam Constitutional:      General: He is not in acute distress.    Appearance: Normal appearance. He is well-developed and normal weight. He is not ill-appearing, toxic-appearing or diaphoretic.  HENT:     Head: Normocephalic and atraumatic.     Right Ear: External ear normal.     Left Ear: External ear normal.     Nose: Nose normal.     Mouth/Throat:     Pharynx: Oropharynx is clear.  Eyes:     General: No scleral icterus.       Right eye: No discharge.        Left eye: No discharge.     Extraocular Movements: Extraocular movements intact.     Pupils: Pupils are equal, round, and reactive to light.  Cardiovascular:     Rate and Rhythm: Normal rate.  Pulmonary:     Effort: Pulmonary effort is normal.  Genitourinary:   Musculoskeletal:  Cervical back: Normal range of motion.  Skin:    General: Skin is warm and dry.  Neurological:     Mental Status: He is alert and oriented to person, place, and time.  Psychiatric:        Mood and Affect: Mood normal.        Behavior: Behavior normal.        Thought Content: Thought content normal.        Judgment: Judgment normal.      Assessment and Plan :   PDMP not reviewed this encounter.  1. Rash and nonspecific skin eruption     Case discussed with Dr. Leonides Grills. Had extensive discussion with patient about retesting for genital herpes. Emphasized that his test would likely be false negative as he is already taking Valtrex, has no current blisters and only a resolving rash of the penis. Ultimately, patient decided to retest and insisted that he performed the swab himself. Labs pending. Refilled his Valtrex and  counseled on appropriate use of this for genital herpes. Recommended that he have a recheck with a primary care provider for back in our clinic if he starts to have frequent outbreaks. I provided a detailed explanation for him and his visit summary. Counseled patient on potential for adverse effects with medications prescribed/recommended today, ER and return-to-clinic precautions discussed, patient verbalized understanding.    Wallis Bamberg, [place] 01/02/21 [id number]

## 2021-01-02 NOTE — ED Triage Notes (Signed)
Pt presents for STD's testing. Pt is concern as his girlfriend is pregnant. Denies dysuria, penile discharge, abdominal pain, fever.  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

## 2021-01-03 LAB — CYTOLOGY, (ORAL, ANAL, URETHRAL) ANCILLARY ONLY
Chlamydia: NEGATIVE
Comment: NEGATIVE
Comment: NEGATIVE
Comment: NORMAL
Neisseria Gonorrhea: NEGATIVE
Trichomonas: NEGATIVE

## 2021-01-03 LAB — RPR: RPR Ser Ql: NONREACTIVE

## 2021-01-04 LAB — HSV CULTURE AND TYPING

## 2021-03-15 ENCOUNTER — Encounter (HOSPITAL_COMMUNITY): Payer: Self-pay

## 2021-03-15 ENCOUNTER — Ambulatory Visit (HOSPITAL_COMMUNITY)
Admission: RE | Admit: 2021-03-15 | Discharge: 2021-03-15 | Disposition: A | Discharge status: Home / Self Care | Payer: 59 | Source: Ambulatory Visit | Attending: Student | Admitting: Student

## 2021-03-15 ENCOUNTER — Other Ambulatory Visit: Payer: Self-pay

## 2021-03-15 VITALS — BP 119/72 | HR 71 | Temp 98.6°F | Resp 17

## 2021-03-15 DIAGNOSIS — L03811 Cellulitis of head [any part, except face]: Secondary | ICD-10-CM | POA: Diagnosis not present

## 2021-03-15 MED ORDER — DOXYCYCLINE HYCLATE 100 MG PO CAPS: 100.0000 mg | ORAL_CAPSULE | Freq: Two times a day (BID) | ORAL | 0 refills | Status: AC

## 2021-03-15 MED ORDER — IBUPROFEN 800 MG PO TABS: 800.0000 mg | ORAL_TABLET | Freq: Three times a day (TID) | ORAL | 0 refills | Status: DC

## 2021-03-15 NOTE — ED Triage Notes (Signed)
Pt c/o a possible spider bite. He states he has been having a headache, ear ache and tooth ache. He states he saw spider webs and has been trying to get rid of them and does not know if there were any spiders on the web. Pt states the left side of his face is ringing.

## 2021-03-15 NOTE — Discharge Instructions (Addendum)
-  Doxycycline twice daily for 7 days -Take Tylenol 1000 mg 3 times daily, and ibuprofen 800 mg 3 times daily with food.  You can take these together, or alternate every 3-4 hours. -Wash your wound with gentle soap and water 1-2 times daily.  Let air dry or gently pat. -Seek additional medical attention if your symptoms worsen or persist, like new fever/chills, discharge from the wound, dizziness, etc.

## 2021-03-15 NOTE — ED Provider Notes (Addendum)
[facility]    CSN: [id number] Arrival date & time: 03/15/21  1648      History   Chief Complaint Chief Complaint  Patient presents with  . Insect Bite  . Appointment    HPI Julian Pugh is a 35 y.o. male presenting with possible spider bite.  States he cleaned off his car 2 days ago and there were a lot of spider webs on it.  Denies actually seeing any spiders or known bite.  States that he has had 2 days of redness and pain on the top of his head since this event however.  Endorses subjective chills and throbbing headaches.  Some pain in front of his ear from lymph node.  Denies dizziness, chest pain, shortness of breath, weakness.   Denies ear pain, hearing changes, jaw pain.  HPI  Past Medical History:  Diagnosis Date  . Bipolar 1 disorder (HCC)   . Depression   . Eczema   . Hypertension     Patient Active Problem List   Diagnosis Date Noted  . Atopic eczema 10/30/2011    History reviewed. No pertinent surgical history.     Home Medications    Prior to Admission medications   Medication Sig Start Date End Date Taking? Authorizing Provider  doxycycline (VIBRAMYCIN) 100 MG capsule Take 1 capsule (100 mg total) by mouth 2 (two) times daily for 7 days. 03/15/21 03/22/21 Yes Rhys Martini, PA-C  ibuprofen (ADVIL) 800 MG tablet Take 1 tablet (800 mg total) by mouth 3 (three) times daily. 03/15/21  Yes Rhys Martini, PA-C  gabapentin (NEURONTIN) 300 MG capsule Take 300 mg by mouth 3 (three) times daily. 11/16/20   [provider]  tamsulosin (FLOMAX) 0.4 MG CAPS capsule Take 0.4 mg by mouth daily. 10/05/20   [provider]  triamcinolone cream (KENALOG) 0.1 % Apply 1 application topically 2 (two) times daily. 08/16/20   Mardella Layman, MD  valACYclovir (VALTREX) 1000 MG tablet At the start of an outbreak, start taking 1 tablet daily for 5 days. 01/02/21   Wallis Bamberg, PA-C    Family History Family History  Problem Relation Age of Onset   . Healthy Mother   . Healthy Father     Social History Social History   Tobacco Use  . Smoking status: Current Every Day Smoker    Packs/day: 0.50    Years: 11.00    Pack years: 5.50    Types: Cigarettes  . Smokeless tobacco: Never Used  Vaping Use  . Vaping Use: Never used  Substance Use Topics  . Alcohol use: Yes  . Drug use: Yes    Types: Marijuana     Allergies   Patient has no known allergies.   Review of Systems Review of Systems  Skin: Positive for wound.  All other systems reviewed and are negative.    Physical Exam Triage Vital Signs ED Triage Vitals  Enc Vitals Group     BP 03/15/21 1714 119/72     Pulse Rate 03/15/21 1711 71     Resp 03/15/21 1711 17     Temp 03/15/21 1714 98.6 F (37 C)     Temp Source 03/15/21 1711 Oral     SpO2 03/15/21 1711 100 %     Weight --      Height --      Head Circumference --      Peak Flow --      Pain Score 03/15/21 1709 10  Pain Loc --      Pain Edu? --      Excl. in GC? --    No data found.  Updated Vital Signs BP 119/72   Pulse 71   Temp 98.6 F (37 C) (Oral)   Resp 17   SpO2 100%   Visual Acuity Right Eye Distance:   Left Eye Distance:   Bilateral Distance:    Right Eye Near:   Left Eye Near:    Bilateral Near:     Physical Exam Vitals reviewed.  Constitutional:      General: He is not in acute distress.    Appearance: Normal appearance. He is not ill-appearing or diaphoretic.  HENT:     Head: Normocephalic and atraumatic.     Right Ear: Hearing, tympanic membrane, ear canal and external ear normal. Tympanic membrane is not erythematous, retracted or bulging.     Left Ear: Hearing, tympanic membrane, ear canal and external ear normal. Tympanic membrane is not erythematous, retracted or bulging.     Ears:     Comments: L preauricular lymphadenopathy Cardiovascular:     Rate and Rhythm: Normal rate and regular rhythm.     Heart sounds: Normal heart sounds.  Pulmonary:     Effort:  Pulmonary effort is normal.     Breath sounds: Normal breath sounds.  Skin:    General: Skin is warm.     Comments: (see image) Scalp with 1.5x1.5cm area of erythema and warmth.   Neurological:     General: No focal deficit present.     Mental Status: He is alert and oriented to person, place, and time.     Comments: CN 2-12 grossly intact Strength 5/5 UEs and LEs  Psychiatric:        Mood and Affect: Mood normal.        Behavior: Behavior normal.        Thought Content: Thought content normal.        Judgment: Judgment normal.        UC Treatments / Results  Labs (all labs ordered are listed, but only abnormal results are displayed) Labs Reviewed - No data to display  EKG   Radiology No results found.  Procedures Procedures (including critical care time)  Medications Ordered in UC Medications - No data to display  Initial Impression / Assessment and Plan / UC Course  I have reviewed the triage vital signs and the nursing notes.  Pertinent labs & imaging results that were available during my care of the patient were reviewed by me and considered in my medical decision making (see chart for details).    This patient is a 35 year old male presenting with cellulitis following possible spider bite.  He is afebrile and nontachycardic, benign neuro exam.  Has not taken antipyretic today.  Doxycycline sent as below. Ibuprofen.  ED return precautions discussed.  Final Clinical Impressions(s) / UC Diagnoses   Final diagnoses:  Cellulitis of head except face     Discharge Instructions     -Doxycycline twice daily for 7 days -Take Tylenol 1000 mg 3 times daily, and ibuprofen 800 mg 3 times daily with food.  You can take these together, or alternate every 3-4 hours. -Wash your wound with gentle soap and water 1-2 times daily.  Let air dry or gently pat. -Seek additional medical attention if your symptoms worsen or persist, like new fever/chills, discharge from the  wound, dizziness, etc.    ED Prescriptions    Medication  Sig Dispense Auth. Provider   doxycycline (VIBRAMYCIN) 100 MG capsule Take 1 capsule (100 mg total) by mouth 2 (two) times daily for 7 days. 14 capsule Rhys Martini, PA-C   ibuprofen (ADVIL) 800 MG tablet Take 1 tablet (800 mg total) by mouth 3 (three) times daily. 21 tablet Rhys Martini, PA-C     PDMP not reviewed this encounter.   Rhys Martini, PA-C 03/15/21 1812    Rhys Martini, PA-C 03/15/21 [phone]

## 2021-03-17 ENCOUNTER — Ambulatory Visit (HOSPITAL_COMMUNITY)
Admission: RE | Admit: 2021-03-17 | Discharge: 2021-03-17 | Disposition: A | Discharge status: Home / Self Care | Payer: 59 | Source: Ambulatory Visit | Attending: Emergency Medicine | Admitting: Emergency Medicine

## 2021-03-17 ENCOUNTER — Other Ambulatory Visit: Payer: Self-pay

## 2021-03-17 ENCOUNTER — Encounter (HOSPITAL_COMMUNITY): Payer: Self-pay

## 2021-03-17 VITALS — BP 109/82 | HR 85 | Temp 98.1°F | Resp 17

## 2021-03-17 DIAGNOSIS — B029 Zoster without complications: Secondary | ICD-10-CM

## 2021-03-17 MED ORDER — LORATADINE 10 MG PO TABS: 10.0000 mg | ORAL_TABLET | Freq: Every day | ORAL | 0 refills | Status: DC

## 2021-03-17 MED ORDER — VALACYCLOVIR HCL 1 G PO TABS: 1000.0000 mg | ORAL_TABLET | Freq: Three times a day (TID) | ORAL | 0 refills | Status: AC

## 2021-03-17 MED ORDER — TETRACAINE HCL 0.5 % OP SOLN
OPHTHALMIC | Status: AC
  Filled 2021-03-17: qty 4

## 2021-03-17 NOTE — Discharge Instructions (Addendum)
Please take the valtrex three times a day for the next 14 days.   Follow up with an eye doctor (I have listed Dr. Laruth Bouchard office and Dr. Gerhard Perches office) as soon as possible.    I have prescribed Claritin one pill a day for allergies.    You can apply ice to your head for comfort and to help relieve swelling.

## 2021-03-17 NOTE — ED Triage Notes (Addendum)
Pt is present today with spider bites on his head and above his left eye. Pt states that he does have an HA, toothache, and left ear ache that he thinks may be a results from the spider bites. Pt states that he is still taking the antibiotics that were prescribe at his pervious visit.

## 2021-03-17 NOTE — ED Provider Notes (Signed)
[facility]    CSN: [id number] Arrival date & time: 03/17/21  1639      History   Chief Complaint Chief Complaint  Patient presents with  . Insect Bite    HPI NICOLE HAFLEY is a 35 y.o. male.   Patient here for evaluation of rash to left side of his head and left eye.  Patient reports initially believed rash to be related to a spider bite and was evaluated several days ago for same.  At that time he was given doxycycline and ibuprofen.  He reports taking medications with no relief and states that rash is worsening.  Reports pain worsening especially around left eye but denies any vision changes. Denies any specific alleviating or aggravating factors.  Denies any fevers, chest pain, shortness of breath, N/V/D, numbness, tingling, weakness, abdominal pain, or headaches.   ROS: As per HPI, all other pertinent ROS negative   The history is provided by the patient.    Past Medical History:  Diagnosis Date  . Bipolar 1 disorder (HCC)   . Depression   . Eczema   . Hypertension     Patient Active Problem List   Diagnosis Date Noted  . Atopic eczema 10/30/2011    History reviewed. No pertinent surgical history.     Home Medications    Prior to Admission medications   Medication Sig Start Date End Date Taking? Authorizing Provider  loratadine (CLARITIN) 10 MG tablet Take 1 tablet (10 mg total) by mouth daily. 03/17/21  Yes Ivette Loyal, NP  valACYclovir (VALTREX) 1000 MG tablet Take 1 tablet (1,000 mg total) by mouth 3 (three) times daily for 14 days. 03/17/21 03/31/21 Yes Ivette Loyal, NP  doxycycline (VIBRAMYCIN) 100 MG capsule Take 1 capsule (100 mg total) by mouth 2 (two) times daily for 7 days. 03/15/21 03/22/21  Rhys Martini, PA-C  gabapentin (NEURONTIN) 300 MG capsule Take 300 mg by mouth 3 (three) times daily. 11/16/20   [provider]  ibuprofen (ADVIL) 800 MG tablet Take 1 tablet (800 mg total) by mouth 3 (three) times daily. 03/15/21    Rhys Martini, PA-C  tamsulosin (FLOMAX) 0.4 MG CAPS capsule Take 0.4 mg by mouth daily. 10/05/20   [provider]  triamcinolone cream (KENALOG) 0.1 % Apply 1 application topically 2 (two) times daily. 08/16/20   Mardella Layman, MD    Family History Family History  Problem Relation Age of Onset  . Healthy Mother   . Healthy Father     Social History Social History   Tobacco Use  . Smoking status: Current Every Day Smoker    Packs/day: 0.50    Years: 11.00    Pack years: 5.50    Types: Cigarettes  . Smokeless tobacco: Never Used  Vaping Use  . Vaping Use: Never used  Substance Use Topics  . Alcohol use: Yes  . Drug use: Yes    Types: Marijuana     Allergies   Patient has no known allergies.   Review of Systems Review of Systems  Skin: Positive for rash.     Physical Exam Triage Vital Signs ED Triage Vitals  Enc Vitals Group     BP 03/17/21 1701 109/82     Pulse Rate 03/17/21 1701 85     Resp 03/17/21 1701 17     Temp 03/17/21 1703 98.1 F (36.7 C)     Temp Source 03/17/21 1701 Oral     SpO2 03/17/21 1701 97 %  Weight --      Height --      Head Circumference --      Peak Flow --      Pain Score 03/17/21 1701 7     Pain Loc --      Pain Edu? --      Excl. in GC? --    No data found.  Updated Vital Signs BP 109/82 (BP Location: Right Arm)   Pulse 85   Temp 98.1 F (36.7 C)   Resp 17   SpO2 97%   Visual Acuity Right Eye Distance:   Left Eye Distance:   Bilateral Distance:    Right Eye Near:   Left Eye Near:    Bilateral Near:     Physical Exam Vitals and nursing note reviewed.  Constitutional:      General: He is not in acute distress.    Appearance: Normal appearance. He is not ill-appearing, toxic-appearing or diaphoretic.  HENT:     Head: Normocephalic and atraumatic.  Eyes:     Conjunctiva/sclera: Conjunctivae normal.  Cardiovascular:     Rate and Rhythm: Normal rate.     Pulses: Normal pulses.  Pulmonary:      Effort: Pulmonary effort is normal.  Abdominal:     General: Abdomen is flat.  Musculoskeletal:        General: Normal range of motion.     Cervical back: Normal range of motion.  Skin:    General: Skin is warm and dry.     Findings: Rash (see photos below) present.  Neurological:     General: No focal deficit present.     Mental Status: He is alert and oriented to person, place, and time.  Psychiatric:        Mood and Affect: Mood normal.          UC Treatments / Results  Labs (all labs ordered are listed, but only abnormal results are displayed) Labs Reviewed - No data to display  EKG   Radiology No results found.  Procedures Procedures (including critical care time)  Medications Ordered in UC Medications - No data to display  Initial Impression / Assessment and Plan / UC Course  I have reviewed the triage vital signs and the nursing notes.  Pertinent labs & imaging results that were available during my care of the patient were reviewed by me and considered in my medical decision making (see chart for details).     Vesicular rash located to left periorbital area and left side of his head.  Assessment concerning for shingles.  Attempted to use tetracaine and fluorescein to ensure that patient does not have herpes zoster ophthalmicus but patient refused.  Patient states that he does not trust assessment and diagnosis of shingles as he believes he is having an allergic reaction to a spider bite. Patient also express frustration as he states at the previous visit he was not told to use ice or that allergy medication could potentially help, which he read on the Internet.  Patient states that he does not trust urgent care and would like to leave.  It was explained that shingles can get worse and the concern was that as it is around his eye he could end up with vision damage or loss.  Patient still not agreeable to eye exam.  After extensive discussion patient is agreeable to  take the prescription for Valtrex.  He did report that he is going to do his own research before he  takes any medications.  It was again explained the importance of treating shingles especially as it is around his eye.  Patient encouraged to take Valtrex as prescribed and follow-up with an eye doctor as soon as possible.  Patient was given contact information for 2 ophthalmologists for follow-up.    Patient additionally was given a prescription for Claritin as he continued to express frustration that he was not offered that at his last visit.  Final Clinical Impressions(s) / UC Diagnoses   Final diagnoses:  Herpes zoster without complication     Discharge Instructions     Please take the valtrex three times a day for the next 14 days.   Follow up with an eye doctor (I have listed Dr. Laruth Bouchard office and Dr. Gerhard Perches office) as soon as possible.    I have prescribed Claritin one pill a day for allergies.    You can apply ice to your head for comfort and to help relieve swelling.       ED Prescriptions    Medication Sig Dispense Auth. Provider   valACYclovir (VALTREX) 1000 MG tablet Take 1 tablet (1,000 mg total) by mouth 3 (three) times daily for 14 days. 42 tablet Chales Salmon R, NP   loratadine (CLARITIN) 10 MG tablet Take 1 tablet (10 mg total) by mouth daily. 30 tablet Ivette Loyal, NP     PDMP not reviewed this encounter.   Ivette Loyal, NP 03/17/21 1857
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
## 2021-08-21 ENCOUNTER — Ambulatory Visit (INDEPENDENT_AMBULATORY_CARE_PROVIDER_SITE_OTHER): Payer: 59 | Admitting: Family

## 2021-08-21 ENCOUNTER — Other Ambulatory Visit: Payer: Self-pay

## 2021-08-21 ENCOUNTER — Encounter: Payer: Self-pay | Admitting: Family

## 2021-08-21 ENCOUNTER — Ambulatory Visit (INDEPENDENT_AMBULATORY_CARE_PROVIDER_SITE_OTHER): Payer: 59

## 2021-08-21 VITALS — BP 115/75 | HR 86 | Temp 98.1°F | Resp 18 | Ht 67.24 in | Wt 137.0 lb

## 2021-08-21 DIAGNOSIS — G8929 Other chronic pain: Secondary | ICD-10-CM

## 2021-08-21 DIAGNOSIS — M545 Low back pain, unspecified: Secondary | ICD-10-CM | POA: Diagnosis not present

## 2021-08-21 DIAGNOSIS — M79605 Pain in left leg: Secondary | ICD-10-CM

## 2021-08-21 DIAGNOSIS — Z7689 Persons encountering health services in other specified circumstances: Secondary | ICD-10-CM

## 2021-08-21 NOTE — Patient Instructions (Signed)

## 2021-08-21 NOTE — Progress Notes (Signed)
Pt presents to establish care 

## 2021-08-22 ENCOUNTER — Other Ambulatory Visit: Payer: Self-pay | Admitting: Family

## 2021-08-22 DIAGNOSIS — G8929 Other chronic pain: Secondary | ICD-10-CM

## 2021-08-22 DIAGNOSIS — M545 Low back pain, unspecified: Secondary | ICD-10-CM

## 2021-08-22 NOTE — Progress Notes (Signed)
Referral to Orthopedics for L4-L5 spurring. Their office should call patient within 2 weeks with appointment details.

## 2021-08-22 NOTE — Progress Notes (Signed)
MRI left lower extremity ordered. Will ask CMA to call [facility] to schedule for patient.

## 2021-08-28 ENCOUNTER — Encounter: Payer: Self-pay | Admitting: Surgery

## 2021-08-28 ENCOUNTER — Ambulatory Visit (INDEPENDENT_AMBULATORY_CARE_PROVIDER_SITE_OTHER): Payer: 59 | Admitting: Surgery

## 2021-08-28 VITALS — BP 113/72 | HR 78 | Ht 67.24 in | Wt 137.0 lb

## 2021-08-28 DIAGNOSIS — M5416 Radiculopathy, lumbar region: Secondary | ICD-10-CM | POA: Diagnosis not present

## 2021-08-28 DIAGNOSIS — R29898 Other symptoms and signs involving the musculoskeletal system: Secondary | ICD-10-CM
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
## 2021-09-14 ENCOUNTER — Other Ambulatory Visit: Payer: Self-pay | Admitting: Surgery

## 2021-09-14 DIAGNOSIS — Z77018 Contact with and (suspected) exposure to other hazardous metals: Secondary | ICD-10-CM

## 2021-09-24 ENCOUNTER — Telehealth: Payer: Self-pay | Admitting: Family

## 2021-09-24 NOTE — Telephone Encounter (Signed)
Adair Laundry from [facility] called saying patient has an appt. With them on 09/28/21 and needs a prior auth. Patient has [facility].   Please f/u

## 2021-09-27 ENCOUNTER — Other Ambulatory Visit: Payer: Self-pay | Admitting: *Deleted

## 2021-09-28 ENCOUNTER — Ambulatory Visit
Admission: RE | Admit: 2021-09-28 | Discharge: 2021-09-28 | Disposition: A | Discharge status: Home / Self Care | Payer: 59 | Source: Ambulatory Visit | Attending: Surgery | Admitting: Surgery

## 2021-09-28 ENCOUNTER — Other Ambulatory Visit: Payer: 59

## 2021-09-28 ENCOUNTER — Ambulatory Visit
Admission: RE | Admit: 2021-09-28 | Discharge: 2021-09-28 | Disposition: A | Discharge status: Home / Self Care | Payer: 59 | Source: Ambulatory Visit | Attending: Family | Admitting: Family

## 2021-09-28 DIAGNOSIS — M79605 Pain in left leg: Secondary | ICD-10-CM

## 2021-09-28 DIAGNOSIS — Z77018 Contact with and (suspected) exposure to other hazardous metals: Secondary | ICD-10-CM

## 2021-09-28 DIAGNOSIS — G8929 Other chronic pain: Secondary | ICD-10-CM

## 2021-09-28 DIAGNOSIS — M5416 Radiculopathy, lumbar region: Secondary | ICD-10-CM

## 2021-09-28 DIAGNOSIS — R29898 Other symptoms and signs involving the musculoskeletal system: Secondary | ICD-10-CM

## 2021-09-28 NOTE — Progress Notes (Signed)
Patient ID: Julian Pugh, male    DOB: 1986-02-13  MRN: [id number]  CC: Annual Physical Exam   Subjective: Julian Pugh is a 35 y.o. male who presents for annual physical exam.   His concerns today include:  Concern that prostate is ok, discomfort in that area, no red flag symptoms present. Reports prostate issues may be secondary to bulging/slipped discs in lower back.  Patient Active Problem List   Diagnosis Date Noted   Atopic eczema 10/30/2011     Current Outpatient Medications on File Prior to Visit  Medication Sig Dispense Refill   gabapentin (NEURONTIN) 300 MG capsule Take 300 mg by mouth 3 (three) times daily.     ibuprofen (ADVIL) 800 MG tablet Take 1 tablet (800 mg total) by mouth 3 (three) times daily. 21 tablet 0   loratadine (CLARITIN) 10 MG tablet Take 1 tablet (10 mg total) by mouth daily. 30 tablet 0   tamsulosin (FLOMAX) 0.4 MG CAPS capsule Take 0.4 mg by mouth daily.     triamcinolone cream (KENALOG) 0.1 % Apply 1 application topically 2 (two) times daily. 453.6 g 1   No current facility-administered medications on file prior to visit.    No Known Allergies  Social History   Socioeconomic History   Marital status: Significant Other    Spouse name: Not on file   Number of children: Not on file   Years of education: Not on file   Highest education level: Not on file  Occupational History   Not on file  Tobacco Use   Smoking status: Every Day    Packs/day: 0.50    Years: 11.00    Pack years: 5.50    Types: Cigarettes   Smokeless tobacco: Never  Vaping Use   Vaping Use: Never used  Substance and Sexual Activity   Alcohol use: Yes   Drug use: Yes    Types: Marijuana   Sexual activity: Never  Other Topics Concern   Not on file  Social History Narrative   Not on file   Social Determinants of Health   Financial Resource Strain: Not on file  Food Insecurity: Not on file  Transportation Needs: Not on file  Physical Activity: Not on file   Stress: Not on file  Social Connections: Not on file  Intimate Partner Violence: Not on file    Family History  Problem Relation Age of Onset   Healthy Mother    Healthy Father     No past surgical history on file.  ROS: Review of Systems Negative except as stated above  PHYSICAL EXAM: BP 105/73 (BP Location: Left Arm, Patient Position: Sitting, Cuff Size: Normal)   Pulse 75   Temp 98.8 F (37.1 C)   Resp 16   Ht 5' 7.24" (1.708 m)   Wt 139 lb (63 kg)   SpO2 96%   BMI 21.61 kg/m    Physical Exam HENT:     Head: Normocephalic and atraumatic.     Right Ear: Tympanic membrane, ear canal and external ear normal.     Left Ear: Tympanic membrane, ear canal and external ear normal.  Eyes:     Extraocular Movements: Extraocular movements intact.     Conjunctiva/sclera: Conjunctivae normal.     Pupils: Pupils are equal, round, and reactive to light.  Cardiovascular:     Rate and Rhythm: Normal rate and regular rhythm.     Pulses: Normal pulses.     Heart sounds: Normal heart sounds.  Pulmonary:     Effort: Pulmonary effort is normal.     Breath sounds: Normal breath sounds.  Abdominal:     General: Bowel sounds are normal.     Palpations: Abdomen is soft.  Genitourinary:    Comments: Patient declined exam. Musculoskeletal:        General: Normal range of motion.     Cervical back: Normal range of motion and neck supple.  Skin:    General: Skin is warm and dry.     Capillary Refill: Capillary refill takes less than 2 seconds.  Neurological:     General: No focal deficit present.     Mental Status: He is alert and oriented to person, place, and time.  Psychiatric:        Mood and Affect: Mood normal.        Behavior: Behavior normal.    ASSESSMENT AND PLAN: 1. Annual physical exam: - Counseled on 150 minutes of exercise per week as tolerated, healthy eating (including decreased daily intake of saturated fats, cholesterol, added sugars, sodium), STI  prevention, and routine healthcare maintenance.  2. Screening for metabolic disorder: - ZTI45+YKDX to check kidney function, liver function, and electrolyte balance.  - CMP14+EGFR  3. Screening for deficiency anemia: - CBC to screen for anemia. - CBC  4. Diabetes mellitus screening: - Hemoglobin A1c to screen for pre-diabetes/diabetes. - Hemoglobin A1c  5. Screening cholesterol level: - Lipid panel to screen for high cholesterol.  - Lipid panel  6. Thyroid disorder screen: - TSH to check thyroid function.  - TSH  7. Need for hepatitis C screening test: - Hepatitis C antibody to screen for hepatitis C.  - Hepatitis C Antibody  8. Prostate cancer screening: - PSA for screening. - PSA   Patient was given the opportunity to ask questions.  Patient verbalized understanding of the plan and was able to repeat key elements of the plan. Patient was given clear instructions to go to Emergency Department or return to medical center if symptoms don't improve, worsen, or new problems develop.The patient verbalized understanding.   Orders Placed This Encounter  Procedures   Hepatitis C Antibody   CBC   Lipid panel   CMP14+EGFR   Hemoglobin A1c   TSH   PSA     Return in about 1 year (around 10/01/2022) for Physical per patient preference.  Camillia Herter, NP

## 2021-10-01 ENCOUNTER — Encounter: Payer: Self-pay | Admitting: Family

## 2021-10-01 ENCOUNTER — Other Ambulatory Visit: Payer: Self-pay

## 2021-10-01 ENCOUNTER — Ambulatory Visit (INDEPENDENT_AMBULATORY_CARE_PROVIDER_SITE_OTHER): Payer: 59 | Admitting: Family

## 2021-10-01 VITALS — BP 105/73 | HR 75 | Temp 98.8°F | Resp 16 | Ht 67.24 in | Wt 139.0 lb

## 2021-10-01 DIAGNOSIS — Z125 Encounter for screening for malignant neoplasm of prostate: Secondary | ICD-10-CM

## 2021-10-01 DIAGNOSIS — Z130 Encounter for screening for diseases of the blood and blood-forming organs and certain disorders involving the immune mechanism: Secondary | ICD-10-CM

## 2021-10-01 DIAGNOSIS — Z13220 Encounter for screening for lipoid disorders: Secondary | ICD-10-CM

## 2021-10-01 DIAGNOSIS — Z1329 Encounter for screening for other suspected endocrine disorder: Secondary | ICD-10-CM

## 2021-10-01 DIAGNOSIS — Z131 Encounter for screening for diabetes mellitus: Secondary | ICD-10-CM

## 2021-10-01 DIAGNOSIS — Z13228 Encounter for screening for other metabolic disorders: Secondary | ICD-10-CM

## 2021-10-01 DIAGNOSIS — Z0000 Encounter for general adult medical examination without abnormal findings: Secondary | ICD-10-CM | POA: Diagnosis not present

## 2021-10-01 DIAGNOSIS — Z1159 Encounter for screening for other viral diseases: Secondary | ICD-10-CM

## 2021-10-01 NOTE — Patient Instructions (Signed)
Preventive Care 40-36 Years Old, Male Preventive care refers to lifestyle choices and visits with your health care provider that can promote health and wellness. This includes: A yearly physical exam. This is also called an annual wellness visit. Regular dental and eye exams. Immunizations. Screening for certain conditions. Healthy lifestyle choices, such as: Eating a healthy diet. Getting regular exercise. Not using drugs or products that contain nicotine and tobacco. Limiting alcohol use. What can I expect for my preventive care visit? Physical exam Your health care provider will check your: Height and weight. These may be used to calculate your BMI (body mass index). BMI is a measurement that tells if you are at a healthy weight. Heart rate and blood pressure. Body temperature. Skin for abnormal spots. Counseling Your health care provider may ask you questions about your: Past medical problems. Family's medical history. Alcohol, tobacco, and drug use. Emotional well-being. Home life and relationship well-being. Sexual activity. Diet, exercise, and sleep habits. Work and work environment. Access to firearms. What immunizations do I need? Vaccines are usually given at various ages, according to a schedule. Your health care provider will recommend vaccines for you based on your age, medical history, and lifestyle or other factors, such as travel or where you work. What tests do I need? Blood tests Lipid and cholesterol levels. These may be checked every 5 years, or more often if you are over 50 years old. Hepatitis C test. Hepatitis B test. Screening Lung cancer screening. You may have this screening every year starting at age 55 if you have a 30-pack-year history of smoking and currently smoke or have quit within the past 15 years. Prostate cancer screening. Recommendations will vary depending on your family history and other risks. Genital exam to check for testicular cancer  or hernias. Colorectal cancer screening. All adults should have this screening starting at age 50 and continuing until age 75. Your health care provider may recommend screening at age 45 if you are at increased risk. You will have tests every 1-10 years, depending on your results and the type of screening test. Diabetes screening. This is done by checking your blood sugar (glucose) after you have not eaten for a while (fasting). You may have this done every 1-3 years. STD (sexually transmitted disease) testing, if you are at risk. Follow these instructions at home: Eating and drinking  Eat a diet that includes fresh fruits and vegetables, whole grains, lean protein, and low-fat dairy products. Take vitamin and mineral supplements as recommended by your health care provider. Do not drink alcohol if your health care provider tells you not to drink. If you drink alcohol: Limit how much you have to 0-2 drinks a day. Be aware of how much alcohol is in your drink. In the [place] one drink equals one 12 oz bottle of beer (355 mL), one 5 oz glass of wine (148 mL), or one 1 oz glass of hard liquor (44 mL). Lifestyle Take daily care of your teeth and gums. Brush your teeth every morning and night with fluoride toothpaste. Floss one time each day. Stay active. Exercise for at least 30 minutes 5 or more days each week. Do not use any products that contain nicotine or tobacco, such as cigarettes, e-cigarettes, and chewing tobacco. If you need help quitting, ask your health care provider. Do not use drugs. If you are sexually active, practice safe sex. Use a condom or other form of protection to prevent STIs (sexually transmitted infections). If told by your   health care provider, take low-dose aspirin daily starting at age 50. Find healthy ways to cope with stress, such as: Meditation, yoga, or listening to music. Journaling. Talking to a trusted person. Spending time with friends and  family. Safety Always wear your seat belt while driving or riding in a vehicle. Do not drive: If you have been drinking alcohol. Do not ride with someone who has been drinking. When you are tired or distracted. While texting. Wear a helmet and other protective equipment during sports activities. If you have firearms in your house, make sure you follow all gun safety procedures. What's next? Go to your health care provider once a year for an annual wellness visit. Ask your health care provider how often you should have your eyes and teeth checked. Stay up to date on all vaccines. This information is not intended to replace advice given to you by your health care provider. Make sure you discuss any questions you have with your health care provider. Document Revised: 01/26/2021 Document Reviewed: 11/12/2018 Elsevier Patient Education  2022 Elsevier Inc.   

## 2021-10-01 NOTE — Progress Notes (Signed)
.  Pt presents for annual physical exam  

## 2021-10-02 ENCOUNTER — Other Ambulatory Visit: Payer: Self-pay | Admitting: Family

## 2021-10-02 ENCOUNTER — Ambulatory Visit (INDEPENDENT_AMBULATORY_CARE_PROVIDER_SITE_OTHER): Payer: 59 | Admitting: Orthopaedic Surgery

## 2021-10-02 ENCOUNTER — Encounter: Payer: Self-pay | Admitting: Orthopaedic Surgery

## 2021-10-02 DIAGNOSIS — M47816 Spondylosis without myelopathy or radiculopathy, lumbar region: Secondary | ICD-10-CM | POA: Insufficient documentation

## 2021-10-02 DIAGNOSIS — G8929 Other chronic pain: Secondary | ICD-10-CM

## 2021-10-02 LAB — CMP14+EGFR
ALT: 13 IU/L (ref 0–44)
AST: 12 IU/L (ref 0–40)
Albumin/Globulin Ratio: 1.9 (ref 1.2–2.2)
Albumin: 4.4 g/dL (ref 4.0–5.0)
Alkaline Phosphatase: 44 IU/L (ref 44–121)
BUN/Creatinine Ratio: 9 (ref 9–20)
BUN: 9 mg/dL (ref 6–20)
Bilirubin Total: 1.7 mg/dL — ABNORMAL HIGH (ref 0.0–1.2)
CO2: 25 mmol/L (ref 20–29)
Calcium: 9.4 mg/dL (ref 8.7–10.2)
Chloride: 103 mmol/L (ref 96–106)
Creatinine, Ser: 1.01 mg/dL (ref 0.76–1.27)
Globulin, Total: 2.3 g/dL (ref 1.5–4.5)
Glucose: 88 mg/dL (ref 70–99)
Potassium: 4 mmol/L (ref 3.5–5.2)
Sodium: 142 mmol/L (ref 134–144)
Total Protein: 6.7 g/dL (ref 6.0–8.5)
eGFR: 99 mL/min/{1.73_m2} (ref >59)

## 2021-10-02 LAB — HEMOGLOBIN A1C
Est. average glucose Bld gHb Est-mCnc: 82 mg/dL
Hgb A1c MFr Bld: 4.5 % — ABNORMAL LOW (ref 4.8–5.6)

## 2021-10-02 LAB — LIPID PANEL
Chol/HDL Ratio: 2.4 ratio (ref 0.0–5.0)
Cholesterol, Total: 167 mg/dL (ref 100–199)
HDL: 71 mg/dL (ref >39)
LDL Chol Calc (NIH): 86 mg/dL (ref 0–99)
Triglycerides: 51 mg/dL (ref 0–149)
VLDL Cholesterol Cal: 10 mg/dL (ref 5–40)

## 2021-10-02 LAB — CBC
Hematocrit: 39.3 % (ref 37.5–51.0)
Hemoglobin: 13.5 g/dL (ref 13.0–17.7)
MCH: 31.8 pg (ref 26.6–33.0)
MCHC: 34.4 g/dL (ref 31.5–35.7)
MCV: 93 fL (ref 79–97)
Platelets: 260 10*3/uL (ref 150–450)
RBC: 4.25 x10E6/uL (ref 4.14–5.80)
RDW: 11.8 % (ref 11.6–15.4)
WBC: 3.1 10*3/uL — ABNORMAL LOW (ref 3.4–10.8)

## 2021-10-02 LAB — TSH: TSH: 1.13 u[IU]/mL (ref 0.450–4.500)

## 2021-10-02 LAB — PSA: Prostate Specific Ag, Serum: 0.8 ng/mL (ref 0.0–4.0)

## 2021-10-02 LAB — HEPATITIS C ANTIBODY: Hep C Virus Ab: <0.1 s/co ratio (ref 0.0–0.9)
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
## 2021-10-02 NOTE — Progress Notes (Signed)
Kidney function normal.   Liver function normal.   Cholesterol normal.   Thyroid function normal.   Prostate function normal.   No diabetes.   No anemia.  Hepatitis C negative.

## 2021-10-02 NOTE — Progress Notes (Signed)
Left lower extremity with cyst, likely benign. Referral to Orthopedics for further evaluation and management of chronic left lower extremity pain. Their office should call patient within 2 weeks with appointment details.

## 2021-10-19 ENCOUNTER — Ambulatory Visit
Admission: RE | Admit: 2021-10-19 | Discharge: 2021-10-19 | Disposition: A | Discharge status: Home / Self Care | Payer: 59 | Source: Ambulatory Visit | Attending: Physician Assistant | Admitting: Physician Assistant

## 2021-10-19 ENCOUNTER — Other Ambulatory Visit: Payer: Self-pay

## 2021-10-19 VITALS — BP 102/69 | HR 91 | Temp 98.1°F | Resp 18

## 2021-10-19 DIAGNOSIS — K2900 Acute gastritis without bleeding: Secondary | ICD-10-CM | POA: Diagnosis not present

## 2021-10-19 MED ORDER — FAMOTIDINE 20 MG PO TABS: 20.0000 mg | ORAL_TABLET | Freq: Two times a day (BID) | ORAL | 0 refills | Status: DC

## 2021-10-19 NOTE — ED Triage Notes (Signed)
Two day h/o generalized "cramping" abdominal pain with some nausea yesterday. Denies emesis and diarrhea. Pt does not some constipation earlier in the week. Has been eating tablespoons of honey with some relief. Pt also tried pepto bismol but stopped due to dark colored stools. Pt notes use of BC powders.

## 2021-10-19 NOTE — Discharge Instructions (Addendum)
Return if any problems. See your Physician for recheck next week  

## 2021-10-21 NOTE — ED Provider Notes (Signed)
[facility]    CSN: [id number] Arrival date & time: 10/19/21  1355      History   Chief Complaint Chief Complaint  Patient presents with   2p appointment   Abdominal Pain    HPI Julian Pugh is a 35 y.o. male.   Pt concerned that he may have an ulcer.  Pt reports discomfort in both sides of upper abdomen.  Pt took pepto without relief.  Pt reports now stools are black   The history is provided by the patient. No language interpreter was used.  Abdominal Pain Pain location:  RUQ and LUQ Pain radiates to:  Does not radiate Pain severity:  Moderate Onset quality:  Gradual Duration:  2 days Timing:  Constant Relieved by:  Nothing  Past Medical History:  Diagnosis Date   Bipolar 1 disorder (HCC)    Depression    Eczema    Hypertension     Patient Active Problem List   Diagnosis Date Noted   Facet degeneration of lumbar region 10/02/2021   Atopic eczema 10/30/2011    History reviewed. No pertinent surgical history.     Home Medications    Prior to Admission medications   Medication Sig Start Date End Date Taking? Authorizing Provider  famotidine (PEPCID) 20 MG tablet Take 1 tablet (20 mg total) by mouth 2 (two) times daily. 10/19/21 10/19/22 Yes Cheron Schaumann K, PA-C  gabapentin (NEURONTIN) 300 MG capsule Take 300 mg by mouth 3 (three) times daily. 11/16/20   [provider]  ibuprofen (ADVIL) 800 MG tablet Take 1 tablet (800 mg total) by mouth 3 (three) times daily. 03/15/21   Rhys Martini, PA-C  loratadine (CLARITIN) 10 MG tablet Take 1 tablet (10 mg total) by mouth daily. 03/17/21   Ivette Loyal, NP  tamsulosin (FLOMAX) 0.4 MG CAPS capsule Take 0.4 mg by mouth daily. 10/05/20   [provider]  triamcinolone cream (KENALOG) 0.1 % Apply 1 application topically 2 (two) times daily. 08/16/20   Mardella Layman, MD    Family History Family History  Problem Relation Age of Onset   Healthy Mother    Healthy Father      Social History Social History   Tobacco Use   Smoking status: Every Day    Packs/day: 0.50    Years: 11.00    Pack years: 5.50    Types: Cigarettes   Smokeless tobacco: Never  Vaping Use   Vaping Use: Never used  Substance Use Topics   Alcohol use: Yes   Drug use: Yes    Types: Marijuana     Allergies   Patient has no known allergies.   Review of Systems Review of Systems  Gastrointestinal:  Positive for abdominal pain.  All other systems reviewed and are negative.   Physical Exam Triage Vital Signs ED Triage Vitals  Enc Vitals Group     BP 10/19/21 1420 102/69     Pulse Rate 10/19/21 1420 91     Resp 10/19/21 1420 18     Temp 10/19/21 1420 98.1 F (36.7 C)     Temp Source 10/19/21 1420 Oral     SpO2 10/19/21 1420 98 %     Weight --      Height --      Head Circumference --      Peak Flow --      Pain Score 10/19/21 1423 8     Pain Loc --      Pain Edu? --  Excl. in GC? --    No data found.  Updated Vital Signs BP 102/69 (BP Location: Left Arm)   Pulse 91   Temp 98.1 F (36.7 C) (Oral)   Resp 18   SpO2 98%   Visual Acuity Right Eye Distance:   Left Eye Distance:   Bilateral Distance:    Right Eye Near:   Left Eye Near:    Bilateral Near:     Physical Exam Vitals and nursing note reviewed.  Constitutional:      General: He is not in acute distress.    Appearance: He is well-developed.  HENT:     Head: Normocephalic and atraumatic.  Eyes:     Conjunctiva/sclera: Conjunctivae normal.  Cardiovascular:     Rate and Rhythm: Normal rate and regular rhythm.     Heart sounds: No murmur heard. Pulmonary:     Effort: Pulmonary effort is normal. No respiratory distress.     Breath sounds: Normal breath sounds.  Abdominal:     General: Bowel sounds are normal.     Palpations: Abdomen is soft.  Musculoskeletal:        General: No swelling.     Cervical back: Neck supple.  Skin:    General: Skin is warm and dry.     Capillary  Refill: Capillary refill takes less than 2 seconds.  Neurological:     Mental Status: He is alert.  Psychiatric:        Mood and Affect: Mood normal.     UC Treatments / Results  Labs (all labs ordered are listed, but only abnormal results are displayed) Labs Reviewed - No data to display  EKG   Radiology No results found.  Procedures Procedures (including critical care time)  Medications Ordered in UC Medications - No data to display  Initial Impression / Assessment and Plan / UC Course  I have reviewed the triage vital signs and the nursing notes.  Pertinent labs & imaging results that were available during my care of the patient were reviewed by me and considered in my medical decision making (see chart for details).     MDM:  Pt advised pepto can cause black stool,  stop pepto,  Pt advised pepcid.   Final Clinical Impressions(s) / UC Diagnoses   Final diagnoses:  Acute gastritis without hemorrhage, unspecified gastritis type     Discharge Instructions      Return if any problems.  See your Physician for recheck next week    ED Prescriptions     Medication Sig Dispense Auth. Provider   famotidine (PEPCID) 20 MG tablet Take 1 tablet (20 mg total) by mouth 2 (two) times daily. 60 tablet Elson Areas, [place]      PDMP not reviewed this encounter. An After Visit Summary was printed and given to the patient.    Elson Areas, [place] 10/21/21 1354

## 2022-02-07 ENCOUNTER — Other Ambulatory Visit: Payer: Self-pay

## 2022-02-07 DIAGNOSIS — K2900 Acute gastritis without bleeding: Secondary | ICD-10-CM

## 2022-02-07 DIAGNOSIS — G8929 Other chronic pain: Secondary | ICD-10-CM | POA: Insufficient documentation

## 2022-02-07 DIAGNOSIS — M5430 Sciatica, unspecified side: Secondary | ICD-10-CM | POA: Insufficient documentation

## 2022-02-07 DIAGNOSIS — M533 Sacrococcygeal disorders, not elsewhere classified: Secondary | ICD-10-CM | POA: Insufficient documentation

## 2022-02-07 DIAGNOSIS — M5126 Other intervertebral disc displacement, lumbar region: Secondary | ICD-10-CM | POA: Insufficient documentation

## 2022-02-07 MED ORDER — FAMOTIDINE 20 MG PO TABS: 20.0000 mg | ORAL_TABLET | Freq: Two times a day (BID) | ORAL | 0 refills | Status: DC

## 2022-02-21 ENCOUNTER — Other Ambulatory Visit: Payer: Self-pay | Admitting: Family

## 2022-02-21 DIAGNOSIS — K2900 Acute gastritis without bleeding: Secondary | ICD-10-CM

## 2022-04-10 ENCOUNTER — Other Ambulatory Visit: Payer: Self-pay

## 2022-04-10 ENCOUNTER — Ambulatory Visit
Admission: RE | Admit: 2022-04-10 | Discharge: 2022-04-10 | Disposition: A | Discharge status: Home / Self Care | Payer: 59 | Source: Ambulatory Visit | Attending: Physician Assistant | Admitting: Physician Assistant

## 2022-04-10 VITALS — BP 114/75 | HR 81 | Temp 98.8°F | Resp 18

## 2022-04-10 DIAGNOSIS — Z113 Encounter for screening for infections with a predominantly sexual mode of transmission: Secondary | ICD-10-CM | POA: Diagnosis present

## 2022-04-10 DIAGNOSIS — K047 Periapical abscess without sinus: Secondary | ICD-10-CM

## 2022-04-10 MED ORDER — AMOXICILLIN-POT CLAVULANATE 875-125 MG PO TABS
1.0000 | ORAL_TABLET | Freq: Two times a day (BID) | ORAL | 0 refills | Status: DC
Start: 2022-04-10 — End: 2022-11-26

## 2022-04-10 MED ORDER — IBUPROFEN 800 MG PO TABS: 800.0000 mg | ORAL_TABLET | Freq: Three times a day (TID) | ORAL | 0 refills | Status: DC

## 2022-04-10 NOTE — Discharge Instructions (Signed)
You have an infected tooth.  Start Augmentin twice daily for 10 days.  Gargle with warm salt water.  Take ibuprofen 800 mg for pain.  You should not take additional NSAIDs including aspirin, ibuprofen/Advil, naproxen/Aleve as this can cause stomach bleeding.  You can take Tylenol for additional symptom relief.  Please call and schedule appointment with a dentist.  If anything worsens and you have difficulty breathing, swelling of your throat, shortness of breath, change of your voice, trouble swallowing you need to go to the emergency room immediately. ? ?We will contact you with your STI results once we have them available.  If anything is positive we will contact you to arrange treatment.  Please abstain from sex and to receive your results.  It is important that you use a condom with each sexual encounter.  If you develop any symptoms including abdominal pain, fever, nausea, vomiting you need to be seen immediately. ?
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
## 2022-04-10 NOTE — ED Triage Notes (Signed)
Pt here for right lower dental abscess with pain and swelling x 3 days  ?

## 2022-04-11 LAB — CYTOLOGY, (ORAL, ANAL, URETHRAL) ANCILLARY ONLY
Chlamydia: NEGATIVE
Comment: NEGATIVE
Comment: NEGATIVE
Comment: NORMAL
Neisseria Gonorrhea: NEGATIVE
Trichomonas: NEGATIVE

## 2022-04-12 LAB — HIV ANTIBODY (ROUTINE TESTING W REFLEX): HIV Screen 4th Generation wRfx: NONREACTIVE

## 2022-04-12 LAB — HEPATITIS C ANTIBODY: Hep C Virus Ab: NONREACTIVE

## 2022-04-12 LAB — RPR, QUANT+TP ABS (REFLEX)
Rapid Plasma Reagin, Quant: 1:128 {titer} — ABNORMAL HIGH
T Pallidum Abs: REACTIVE — AB

## 2022-04-12 LAB — RPR: RPR Ser Ql: REACTIVE — AB

## 2022-04-15 ENCOUNTER — Telehealth (HOSPITAL_COMMUNITY): Payer: Self-pay | Admitting: Emergency Medicine

## 2022-04-15 NOTE — Telephone Encounter (Signed)
Per protocol, patient will need treatment with IM Bicillin 2.4 million units.   ?Contacted patient by phone.  Verified identity using two identifiers.  Provided positive result.  Reviewed safe sex practices, notifying partners, and refraining from sexual activities for 7 days from time of treatment.  Patient verified understanding, all questions answered.   ?HHS notified ?

## 2022-04-17 ENCOUNTER — Ambulatory Visit
Admission: RE | Admit: 2022-04-17 | Discharge: 2022-04-17 | Disposition: A | Discharge status: Home / Self Care | Payer: 59 | Source: Ambulatory Visit | Attending: Internal Medicine | Admitting: Internal Medicine

## 2022-04-17 DIAGNOSIS — A539 Syphilis, unspecified: Secondary | ICD-10-CM

## 2022-04-17 MED ORDER — PENICILLIN G BENZATHINE 1200000 UNIT/2ML IM SUSY
2.4000 10*6.[IU] | PREFILLED_SYRINGE | Freq: Once | INTRAMUSCULAR | Status: AC
  Administered 2022-04-17: 2.4 10*6.[IU] via INTRAMUSCULAR

## 2022-04-17 NOTE — ED Triage Notes (Signed)
Pt here for rn visit 2/2 RPR(+). Tx administered without complication. Education provided.  ?

## 2022-04-24 NOTE — Progress Notes (Signed)
Erroneous encounter-disregard

## 2022-04-30 ENCOUNTER — Encounter: Payer: 59 | Admitting: Family
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
## 2022-11-26 ENCOUNTER — Ambulatory Visit
Admission: RE | Admit: 2022-11-26 | Discharge: 2022-11-26 | Disposition: A | Discharge status: Home / Self Care | Payer: Commercial Managed Care - HMO | Source: Ambulatory Visit | Attending: Family Medicine | Admitting: Family Medicine

## 2022-11-26 VITALS — BP 101/66 | HR 65 | Temp 99.3°F | Resp 19

## 2022-11-26 DIAGNOSIS — H66003 Acute suppurative otitis media without spontaneous rupture of ear drum, bilateral: Secondary | ICD-10-CM

## 2022-11-26 MED ORDER — AMOXICILLIN 875 MG PO TABS: 875.0000 mg | ORAL_TABLET | Freq: Two times a day (BID) | ORAL | 0 refills | Status: AC

## 2022-11-26 NOTE — Discharge Instructions (Addendum)
Ear Care MD Ear Pain MD 4% Lidocaine Drops may also help relieve some of your ear pain.

## 2022-11-26 NOTE — ED Provider Notes (Signed)
  [facility]   [id number] 11/26/22 Arrival Time: 1601  ASSESSMENT & PLAN:  1. Non-recurrent acute suppurative otitis media of both ears without spontaneous rupture of tympanic membranes    OTC symptom care as needed.  Discharge Medication List as of 11/26/2022  5:24 PM     START taking these medications   Details  amoxicillin (AMOXIL) 875 MG tablet Take 1 tablet (875 mg total) by mouth 2 (two) times daily for 10 days., Starting Tue 11/26/2022, Until Fri 12/06/2022, Normal         Discharge Instructions      Ear Care MD Ear Pain MD 4% Lidocaine Drops may also help relieve some of your ear pain.      Follow-up Information     Rema Fendt, NP.   Specialty: Nurse Practitioner Why: If worsening or failing to improve as anticipated. Contact information: [address] [place] [address] [phone]                 Reviewed expectations re: course of current medical issues. Questions answered. Outlined signs and symptoms indicating need for more acute intervention. Understanding verbalized. After Visit Summary given.   SUBJECTIVE: History from: Patient. Julian Pugh is a 36 y.o. male. Reports: bilateral otalgia; x 1-2 days; R>L. Afebrile. Denies: sore throat and difficulty breathing. Normal PO intake without n/v/d.  OBJECTIVE:  Vitals:   11/26/22 1646  BP: 101/66  Pulse: 65  Resp: 19  Temp: 99.3 F (37.4 C)  TempSrc: Oral  SpO2: 93%    General appearance: alert; no distress Eyes: PERRLA; EOMI; conjunctiva normal HENT: [place]; AT; with mild nasal congestion; both TMs with erythema and bulging Neck: supple  Lungs: speaks full sentences without difficulty; unlabored Extremities: no edema Skin: warm and dry Neurologic: normal gait Psychological: alert and cooperative; normal mood and affect   No Known Allergies  Past Medical History:  Diagnosis Date   Bipolar 1 disorder ([facility])    Depression    Eczema     Hypertension    Social History   Socioeconomic History   Marital status: Significant Other    Spouse name: Not on file   Number of children: Not on file   Years of education: Not on file   Highest education level: Not on file  Occupational History   Not on file  Tobacco Use   Smoking status: Every Day    Packs/day: 0.50    Years: 11.00    Total pack years: 5.50    Types: Cigarettes   Smokeless tobacco: Never  Vaping Use   Vaping Use: Never used  Substance and Sexual Activity   Alcohol use: Yes   Drug use: Yes    Types: Marijuana   Sexual activity: Never  Other Topics Concern   Not on file  Social History Narrative   Not on file   Social Determinants of Health   Financial Resource Strain: Not on file  Food Insecurity: Not on file  Transportation Needs: Not on file  Physical Activity: Not on file  Stress: Not on file  Social Connections: Not on file  Intimate Partner Violence: Not on file   Family History  Problem Relation Age of Onset   Healthy Mother    Healthy Father    History reviewed. No pertinent surgical history.   Mardella Layman, MD 11/26/22 [phone]

## 2022-11-26 NOTE — ED Triage Notes (Signed)
Pt presents to uc with co of otalgia on right side since yesterday morning pt reports using tea tree oil in his hear, pt now has decreased hearing and bilateral otalgia.

## 2022-12-09 NOTE — Progress Notes (Unsigned)
Patient ID: ZADQUIEL TEE, male    DOB: 27-Jun-1986  MRN: [id number]  CC: Follow-Up  Subjective: Julian Pugh is a 37 y.o. male who presents for follow-up.   His concerns today include: ***  Patient Active Problem List   Diagnosis Date Noted   Chronic pain 02/07/2022   Displacement of lumbar intervertebral disc without myelopathy 02/07/2022   Sacrococcygeal disorders, not elsewhere classified 02/07/2022   Sciatica 02/07/2022   Arthropathy of lumbar facet joint 10/02/2021   Depression 12/28/2019   Post-traumatic stress disorder, unspecified 12/28/2019   Nicotine dependence, unspecified, uncomplicated 12/28/2019   Intermittent explosive disorder 12/28/2019   Antisocial personality disorder ([facility]) 12/28/2019   Cannabis dependence in remission ([facility]) 06/11/2019   Atopic eczema 10/30/2011     Current Outpatient Medications on File Prior to Visit  Medication Sig Dispense Refill   famotidine (PEPCID) 20 MG tablet Take 1 tablet (20 mg total) by mouth 2 (two) times daily. 180 tablet 0   gabapentin (NEURONTIN) 300 MG capsule Take 300 mg by mouth 3 (three) times daily. (Patient not taking: Reported on 04/10/2022)     ibuprofen (ADVIL) 800 MG tablet Take 1 tablet (800 mg total) by mouth 3 (three) times daily. 21 tablet 0   loratadine (CLARITIN) 10 MG tablet Take 1 tablet (10 mg total) by mouth daily. 30 tablet 0   tamsulosin (FLOMAX) 0.4 MG CAPS capsule Take 0.4 mg by mouth daily.     triamcinolone cream (KENALOG) 0.1 % Apply 1 application topically 2 (two) times daily. 453.6 g 1   No current facility-administered medications on file prior to visit.    No Known Allergies  Social History   Socioeconomic History   Marital status: Significant Other    Spouse name: Not on file   Number of children: Not on file   Years of education: Not on file   Highest education level: Not on file  Occupational History   Not on file  Tobacco Use   Smoking status: Every Day    Packs/day: 0.50     Years: 11.00    Total pack years: 5.50    Types: Cigarettes   Smokeless tobacco: Never  Vaping Use   Vaping Use: Never used  Substance and Sexual Activity   Alcohol use: Yes   Drug use: Yes    Types: Marijuana   Sexual activity: Never  Other Topics Concern   Not on file  Social History Narrative   Not on file   Social Determinants of Health   Financial Resource Strain: Not on file  Food Insecurity: Not on file  Transportation Needs: Not on file  Physical Activity: Not on file  Stress: Not on file  Social Connections: Not on file  Intimate Partner Violence: Not on file    Family History  Problem Relation Age of Onset   Healthy Mother    Healthy Father     No past surgical history on file.  ROS: Review of Systems Negative except as stated above  PHYSICAL EXAM: There were no vitals taken for this visit.  Physical Exam  {male adult master:310786} {male adult master:310785}     Latest Ref Rng & Units 10/01/2021    2:40 PM 07/10/2019    5:43 AM 10/30/2011    9:34 PM  CMP  Glucose 70 - 99 mg/dL 88  409    BUN 6 - 20 mg/dL 9  7    Creatinine 8.11 - 1.27 mg/dL 9.14  7.82  9.56  Sodium 134 - 144 mmol/L 142  138    Potassium 3.5 - 5.2 mmol/L 4.0  3.3    Chloride 96 - 106 mmol/L 103  103    CO2 20 - 29 mmol/L 25  17    Calcium 8.7 - 10.2 mg/dL 9.4  8.8    Total Protein 6.0 - 8.5 g/dL 6.7     Total Bilirubin 0.0 - 1.2 mg/dL 1.7     Alkaline Phos 44 - 121 IU/L 44     AST 0 - 40 IU/L 12     ALT 0 - 44 IU/L 13      Lipid Panel     Component Value Date/Time   CHOL 167 10/01/2021 1440   TRIG 51 10/01/2021 1440   HDL 71 10/01/2021 1440   CHOLHDL 2.4 10/01/2021 1440   LDLCALC 86 10/01/2021 1440    CBC    Component Value Date/Time   WBC 3.1 (L) 10/01/2021 1440   WBC 7.5 07/10/2019 0543   RBC 4.25 10/01/2021 1440   RBC 4.11 (L) 07/10/2019 0543   HGB 13.5 10/01/2021 1440   HCT 39.3 10/01/2021 1440   PLT 260 10/01/2021 1440   MCV 93 10/01/2021 1440    MCH 31.8 10/01/2021 1440   MCH 32.1 07/10/2019 0543   MCHC 34.4 10/01/2021 1440   MCHC 32.8 07/10/2019 0543   RDW 11.8 10/01/2021 1440   LYMPHSABS 3.6 07/10/2019 0543   MONOABS 0.7 07/10/2019 0543   EOSABS 0.3 07/10/2019 0543   BASOSABS 0.1 07/10/2019 0543    ASSESSMENT AND PLAN:  There are no diagnoses linked to this encounter.   Patient was given the opportunity to ask questions.  Patient verbalized understanding of the plan and was able to repeat key elements of the plan. Patient was given clear instructions to go to Emergency Department or return to medical center if symptoms don't improve, worsen, or new problems develop.The patient verbalized understanding.   No orders of the defined types were placed in this encounter.    Requested Prescriptions    No prescriptions requested or ordered in this encounter    No follow-ups on file.  Rema Fendt, NP

## 2022-12-11 ENCOUNTER — Encounter: Payer: Commercial Managed Care - HMO | Admitting: Family

## 2022-12-13 ENCOUNTER — Other Ambulatory Visit: Payer: Self-pay

## 2022-12-13 ENCOUNTER — Ambulatory Visit
Admission: RE | Admit: 2022-12-13 | Discharge: 2022-12-13 | Disposition: A | Discharge status: Home / Self Care | Payer: Medicaid Other | Source: Ambulatory Visit | Attending: Urgent Care | Admitting: Urgent Care

## 2022-12-13 ENCOUNTER — Emergency Department (HOSPITAL_COMMUNITY): Payer: 59

## 2022-12-13 ENCOUNTER — Emergency Department (HOSPITAL_COMMUNITY)
Admission: EM | Admit: 2022-12-13 | Discharge: 2022-12-14 | Disposition: A | Discharge status: Home / Self Care | Payer: 59 | Attending: Emergency Medicine | Admitting: Emergency Medicine

## 2022-12-13 ENCOUNTER — Encounter (HOSPITAL_COMMUNITY): Payer: Self-pay | Admitting: *Deleted

## 2022-12-13 VITALS — BP 125/84 | HR 69 | Temp 98.3°F | Resp 19

## 2022-12-13 DIAGNOSIS — K047 Periapical abscess without sinus: Secondary | ICD-10-CM

## 2022-12-13 DIAGNOSIS — H9192 Unspecified hearing loss, left ear: Secondary | ICD-10-CM | POA: Insufficient documentation

## 2022-12-13 DIAGNOSIS — R45850 Homicidal ideations: Secondary | ICD-10-CM | POA: Diagnosis not present

## 2022-12-13 DIAGNOSIS — F331 Major depressive disorder, recurrent, moderate: Secondary | ICD-10-CM | POA: Diagnosis not present

## 2022-12-13 DIAGNOSIS — F32A Depression, unspecified: Secondary | ICD-10-CM | POA: Diagnosis present

## 2022-12-13 DIAGNOSIS — Z733 Stress, not elsewhere classified: Secondary | ICD-10-CM | POA: Diagnosis not present

## 2022-12-13 DIAGNOSIS — F439 Reaction to severe stress, unspecified: Secondary | ICD-10-CM

## 2022-12-13 DIAGNOSIS — M5136 Other intervertebral disc degeneration, lumbar region: Secondary | ICD-10-CM

## 2022-12-13 DIAGNOSIS — G894 Chronic pain syndrome: Secondary | ICD-10-CM

## 2022-12-13 DIAGNOSIS — H748X3 Other specified disorders of middle ear and mastoid, bilateral: Secondary | ICD-10-CM | POA: Diagnosis not present

## 2022-12-13 DIAGNOSIS — H9202 Otalgia, left ear: Secondary | ICD-10-CM

## 2022-12-13 DIAGNOSIS — R4587 Impulsiveness: Secondary | ICD-10-CM | POA: Diagnosis not present

## 2022-12-13 DIAGNOSIS — H9313 Tinnitus, bilateral: Secondary | ICD-10-CM | POA: Diagnosis not present

## 2022-12-13 DIAGNOSIS — H6993 Unspecified Eustachian tube disorder, bilateral: Secondary | ICD-10-CM | POA: Diagnosis not present

## 2022-12-13 DIAGNOSIS — H9190 Unspecified hearing loss, unspecified ear: Secondary | ICD-10-CM | POA: Diagnosis not present

## 2022-12-13 DIAGNOSIS — R451 Restlessness and agitation: Secondary | ICD-10-CM | POA: Diagnosis not present

## 2022-12-13 DIAGNOSIS — R45851 Suicidal ideations: Secondary | ICD-10-CM | POA: Insufficient documentation

## 2022-12-13 DIAGNOSIS — H748X2 Other specified disorders of left middle ear and mastoid: Secondary | ICD-10-CM | POA: Diagnosis not present

## 2022-12-13 LAB — BASIC METABOLIC PANEL
Anion gap: 7 (ref 5–15)
BUN: 5 mg/dL — ABNORMAL LOW (ref 6–20)
CO2: 29 mmol/L (ref 22–32)
Calcium: 8.8 mg/dL — ABNORMAL LOW (ref 8.9–10.3)
Chloride: 103 mmol/L (ref 98–111)
Creatinine, Ser: 0.84 mg/dL (ref 0.61–1.24)
GFR, Estimated: >60 mL/min (ref >60)
Glucose, Bld: 94 mg/dL (ref 70–99)
Potassium: 3.6 mmol/L (ref 3.5–5.1)
Sodium: 139 mmol/L (ref 135–145)

## 2022-12-13 LAB — CBC
HCT: 36.4 % — ABNORMAL LOW (ref 39.0–52.0)
Hemoglobin: 11.8 g/dL — ABNORMAL LOW (ref 13.0–17.0)
MCH: 30.3 pg (ref 26.0–34.0)
MCHC: 32.4 g/dL (ref 30.0–36.0)
MCV: 93.6 fL (ref 80.0–100.0)
Platelets: 279 10*3/uL (ref 150–400)
RBC: 3.89 MIL/uL — ABNORMAL LOW (ref 4.22–5.81)
RDW: 13.2 % (ref 11.5–15.5)
WBC: 7.5 10*3/uL (ref 4.0–10.5)
nRBC: 0 % (ref 0.0–0.2)

## 2022-12-13 MED ORDER — TIZANIDINE HCL 4 MG PO TABS: 4.0000 mg | ORAL_TABLET | Freq: Every day | ORAL | 0 refills | Status: DC

## 2022-12-13 MED ORDER — AMOXICILLIN-POT CLAVULANATE 875-125 MG PO TABS: 1.0000 | ORAL_TABLET | Freq: Two times a day (BID) | ORAL | 0 refills | Status: DC

## 2022-12-13 MED ORDER — FLUTICASONE PROPIONATE 50 MCG/ACT NA SUSP
2.0000 | Freq: Every day | NASAL | 12 refills | Status: DC
Start: 2022-12-13 — End: 2023-11-21

## 2022-12-13 MED ORDER — PSEUDOEPHEDRINE HCL 60 MG PO TABS: 60.0000 mg | ORAL_TABLET | Freq: Three times a day (TID) | ORAL | 0 refills | Status: DC | PRN

## 2022-12-13 MED ORDER — LEVOCETIRIZINE DIHYDROCHLORIDE 5 MG PO TABS
5.0000 mg | ORAL_TABLET | Freq: Every evening | ORAL | 3 refills | Status: DC
Start: 2022-12-13 — End: 2023-11-21

## 2022-12-13 MED ORDER — PREDNISONE 50 MG PO TABS: 50.0000 mg | ORAL_TABLET | Freq: Every day | ORAL | 0 refills | Status: DC

## 2022-12-13 NOTE — ED Triage Notes (Signed)
The pt has had difficulty hearing since christmas day

## 2022-12-13 NOTE — Discharge Instructions (Addendum)
[facility] in [place] Address: [address], [place], [place] [address] Phone: [phone]  [place] [phone] extension [phone] [address].  Dr. Donn Pierini [phone] [place] [phone] [address] [facility].  [facility]cue mission [phone] extension [phone] [address]. [address] [place], [place], [address] First come first serve for the first 10 clients.  May do simple extractions only, no wisdom teeth or surgery.  You may try the second for Thursday of the month starting at [place] [facility] You may call the school to see if they are still helping to provide dental care for emergent cases.

## 2022-12-13 NOTE — ED Provider Triage Note (Signed)
Emergency Medicine Provider Triage Evaluation Note  Julian Pugh , a 37 y.o. male  was evaluated in triage.  Pt complains of hearing loss, dental pain, depression, patient has been dealing with questionable ear infection, was given Augmentin, and reports that he put some tea tree oil in the left side of the ear, since then has been having difficulty hearing but bilaterally.  He does have a noted effusion in the left ear by urgent care.  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  Review of Systems  Positive: Hearing loss, dental pain, depression Negative: Chest pain, shob  Physical Exam  BP 138/82   Pulse 83   Temp 98.7 F (37.1 C)   Resp 17   Ht 5\' 7"  (1.702 m)   Wt 63.5 kg   SpO2 99%   BMI 21.93 kg/m  Gen:   Awake, no distress   Resp:  Normal effort  MSK:   Moves extremities without difficulty  Other:  Patient with left-sided ear effusion, he has some hearing loss bilaterally, left greater than right, no other focal neurologic deficits noted, moves all 4 limbs spontaneously, symptoms been present since late December.  Medical Decision Making  Medically screening exam initiated at 9:29 PM.  Appropriate orders placed.  Julian Pugh was informed that the remainder of the evaluation will be completed by another provider, this initial triage assessment does not replace that evaluation, and the importance of remaining in the ED until their evaluation is complete.  Workup initiated in triage    Julian Pugh 12/13/22 2131

## 2022-12-13 NOTE — ED Triage Notes (Signed)
Pt presents to uc with co of recent ear infection in December. Pt reports he was given antibiotics and it helped but otalgia is back and now ear drainage and tinnitus. Pt reports pain in his  sided of his top of his jaw that is 10/10. Pt reports 400 mg motrin and 500 mg tylenol.   Pt reports he was seen by [facility] who did a hearing test who said it was decreased but no new treatment. Pt was told at this visit there was fluid behind the ear drum
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
## 2022-12-14 DIAGNOSIS — F331 Major depressive disorder, recurrent, moderate: Secondary | ICD-10-CM

## 2022-12-14 DIAGNOSIS — H9192 Unspecified hearing loss, left ear: Secondary | ICD-10-CM | POA: Diagnosis not present

## 2022-12-14 MED ORDER — OXYCODONE-ACETAMINOPHEN 5-325 MG PO TABS
1.0000 | ORAL_TABLET | Freq: Once | ORAL | Status: AC
  Administered 2022-12-14: 1 via ORAL
  Filled 2022-12-14: qty 1

## 2022-12-14 NOTE — Consult Note (Signed)
[facility] Consult   Reason for Consult:  Adjustment Disorder  Referring Physician:  Carlisle Cater Patient Identification: Julian Pugh MRN:  [id number] Principal Diagnosis: Depression Diagnosis:  Principal Problem:   Depression   Total Time spent with patient: 15 minutes  Subjective:   Julian Pugh is a 37 y.o. male presented to [facility] emergency department due to ongoing ear pain.  During the course of this evaluation therapeutic triage consult was ordered due to patient reporting suicidal and homicidal ideations.  He was seen and evaluated face-to-face by this provider where he reports multiple psychosocial stressors related to finances, homelessness and lack of family support.  States " I do not like to feel like I have been disrespected, and I know my thinking is not right sometimes."  Reports he was followed by [place] [facility] services in the past.  However, states he has not follow-up with therapy and psychiatry in a while.  States he felt like he was overmedicated at the time of services.  States he has a diagnosis with posttraumatic stress disorder, depression, attention deficit disorder, insomnia. "  I only want to take trazodone."   He reports he has been unemployed since April 2017 " issues with authority and people being disrespectful."  States his younger son has disability and he needs to be there for his son.   Education was provided with additional outpatient resources for [facility] urgent care facility.  Slightly receptive to plan stating " real mental health help, I don't want any medications either."  Patient was offered inpatient admission however declined stating. "  I only nobody tell me what to do and forcing medication on me."  Patient is able to contract for safety at this time.  Case staffed with attending psychiatrist MD Cinderella.  Support, encouragement and reassurance was provided   Julian Pugh is sitting in no  acute distress.  Presents slightly irritable and guarded. he is alert/oriented x 4; calm/cooperative; and mood congruent with affect. he is speaking in a clear tone at moderate volume, and normal pace; with good eye contact. His thought process is coherent and relevant; There is no indication that he is currently responding to internal/external stimuli or experiencing delusional thought content; and he has denied psychosis, and paranoia. Patient has remained calm throughout assessment and has answered questions appropriately.    At this time Julian Pugh is educated and verbalizes understanding of mental health resources and other crisis services in the community. he is instructed to call 911 and present to the nearest emergency room should he experience any suicidal/homicidal ideation, auditory/visual/hallucinations, or detrimental worsening of his mental health condition. he was a also advised by Probation officer that he could call the toll-free phone on insurance card to assist with identifying in network counselors and agencies or number on back of Medicaid card t speak with care coordinator.  HPI: Per admission assessment note:......" He also notes increasing stressors with his mental health since his ear pain started around Christmas. He states that he has felt homicidal and suicidal at times. He denies overt plan but is afraid at times that he might snap and hurt family members or bystanders. He states that he is able to calm himself down because he thinks he would get charged from hurting someone else, but then also thinks that maybe will be better for him just to be in jail. He voices extreme frustration at the lack of ability to get prompt healthcare, citing his ENT  appointment and wait times in the emergency room. He feels that he is being taken advantage of. He would like to talk to someone about his mental health."  Past Psychiatric History:   Risk to Self:   Risk to Others:   Prior Inpatient  Therapy:   Prior Outpatient Therapy:    Past Medical History:  Past Medical History:  Diagnosis Date   Bipolar 1 disorder ([facility])    Depression    Eczema    Hypertension    History reviewed. No pertinent surgical history. Family History:  Family History  Problem Relation Age of Onset   Healthy Mother    Healthy Father    Family Psychiatric  History:  Social History:  Social History   Substance and Sexual Activity  Alcohol Use Yes     Social History   Substance and Sexual Activity  Drug Use Yes   Types: Marijuana    Social History   Socioeconomic History   Marital status: Significant Other    Spouse name: Not on file   Number of children: Not on file   Years of education: Not on file   Highest education level: Not on file  Occupational History   Not on file  Tobacco Use   Smoking status: Every Day    Packs/day: 0.50    Years: 11.00    Total pack years: 5.50    Types: Cigarettes   Smokeless tobacco: Never  Vaping Use   Vaping Use: Never used  Substance and Sexual Activity   Alcohol use: Yes   Drug use: Yes    Types: Marijuana   Sexual activity: Never  Other Topics Concern   Not on file  Social History Narrative   Not on file   Social Determinants of Health   Financial Resource Strain: Not on file  Food Insecurity: Not on file  Transportation Needs: Not on file  Physical Activity: Not on file  Stress: Not on file  Social Connections: Not on file   Additional Social History:    Allergies:  No Known Allergies  Labs:  Results for orders placed or performed during the hospital encounter of 12/13/22 (from the past 48 hour(s))  CBC     Status: Abnormal   Collection Time: 12/13/22  9:35 PM  Result Value Ref Range   WBC 7.5 4.0 - 10.5 K/uL   RBC 3.89 (L) 4.22 - 5.81 MIL/uL   Hemoglobin 11.8 (L) 13.0 - 17.0 g/dL   HCT 53.6 (L) 14.4 - 31.5 %   MCV 93.6 80.0 - 100.0 fL   MCH 30.3 26.0 - 34.0 pg   MCHC 32.4 30.0 - 36.0 g/dL   RDW 40.0 86.7 - 61.9 %    Platelets 279 150 - 400 K/uL   nRBC 0.0 0.0 - 0.2 %    Comment: Performed at [facility], [address], [place], [place] [address]  Basic metabolic panel     Status: Abnormal   Collection Time: 12/13/22  9:35 PM  Result Value Ref Range   Sodium 139 135 - 145 mmol/L   Potassium 3.6 3.5 - 5.1 mmol/L   Chloride 103 98 - 111 mmol/L   CO2 29 22 - 32 mmol/L   Glucose, Bld 94 70 - 99 mg/dL    Comment: Glucose reference range applies only to samples taken after fasting for at least 8 hours.   BUN 5 (L) 6 - 20 mg/dL   Creatinine, Ser 6.71 0.61 - 1.24 mg/dL  Calcium 8.8 (L) 8.9 - 10.3 mg/dL   GFR, Estimated >60 >60 mL/min    Comment: (NOTE) Calculated using the CKD-EPI Creatinine Equation (2021)    Anion gap 7 5 - 15    Comment: Performed at [facility], [place] [address] [place], [place] [address]    No current facility-administered medications for this encounter.   Current Outpatient Medications  Medication Sig Dispense Refill   fluticasone (FLONASE) 50 MCG/ACT nasal spray Place 2 sprays into both nostrils daily. 16 g 12   amoxicillin-clavulanate (AUGMENTIN) 875-125 MG tablet Take 1 tablet by mouth 2 (two) times daily. (Patient not taking: Reported on 12/13/2022) 20 tablet 0   famotidine (PEPCID) 20 MG tablet Take 1 tablet (20 mg total) by mouth 2 (two) times daily. (Patient not taking: Reported on 12/13/2022) 180 tablet 0   gabapentin (NEURONTIN) 300 MG capsule Take 300 mg by mouth 3 (three) times daily. (Patient not taking: Reported on 04/10/2022)     ibuprofen (ADVIL) 800 MG tablet Take 1 tablet (800 mg total) by mouth 3 (three) times daily. (Patient not taking: Reported on 12/13/2022) 21 tablet 0   levocetirizine (XYZAL) 5 MG tablet Take 1 tablet (5 mg total) by mouth every evening. (Patient not taking: Reported on 12/13/2022) 90 tablet 3   predniSONE (DELTASONE) 50 MG tablet Take 1 tablet (50 mg total) by mouth daily with breakfast. (Patient not taking: Reported on  12/13/2022) 5 tablet 0   pseudoephedrine (SUDAFED) 60 MG tablet Take 1 tablet (60 mg total) by mouth every 8 (eight) hours as needed for congestion. (Patient not taking: Reported on 12/13/2022) 30 tablet 0   tamsulosin (FLOMAX) 0.4 MG CAPS capsule Take 0.4 mg by mouth daily. (Patient not taking: Reported on 12/13/2022)     tiZANidine (ZANAFLEX) 4 MG tablet Take 1 tablet (4 mg total) by mouth at bedtime. (Patient not taking: Reported on 12/13/2022) 30 tablet 0   triamcinolone cream (KENALOG) 0.1 % Apply 1 application topically 2 (two) times daily. (Patient not taking: Reported on 12/13/2022) 453.6 g 1    Musculoskeletal: Strength & Muscle Tone: within normal limits Gait & Station: normal Patient leans: N/A   Psychiatric Specialty Exam:  Presentation  General Appearance: Appropriate for Environment  Eye Contact:Good  Speech:Clear and Coherent  Speech Volume:Normal  Handedness:Right   Mood and Affect  Mood:Depressed; Irritable  Affect:Congruent   Thought Process  Thought Processes:Coherent  Descriptions of Associations:Intact  Orientation:Full (Time, Place and Person)  Thought Content:Logical; Rumination  History of Schizophrenia/Schizoaffective disorder:No data recorded Duration of Psychotic Symptoms:No data recorded Hallucinations:Hallucinations: None  Ideas of Reference:None  Suicidal Thoughts:Suicidal Thoughts: Yes, Passive SI Passive Intent and/or Plan: Without Intent; Without Plan  Homicidal Thoughts:Homicidal Thoughts: Yes, Passive HI Passive Intent and/or Plan: With Intent; Without Plan   Sensorium  Memory:Immediate Fair; Recent Fair  Judgment:Fair  Insight:Fair   Executive Functions  Concentration:Fair  Attention Span:Good  [place] of Knowledge:Good  Language:Good   Psychomotor Activity  Psychomotor Activity:Psychomotor Activity: Normal   Assets  Assets:Desire for Improvement; Housing; Social Support; Transportation   Sleep   Sleep:Sleep: Fair   Physical Exam: Physical Exam Vitals and nursing note reviewed.  Skin:    General: Skin is warm.  Neurological:     Mental Status: He is alert.  Psychiatric:        Mood and Affect: Mood normal.        Thought Content: Thought content normal.    Review of Systems  Psychiatric/Behavioral:  Positive for  depression. Negative for hallucinations. Suicidal ideas: passive ideations.The patient is nervous/anxious.   All other systems reviewed and are negative.  Blood pressure 108/75, pulse 84, temperature 98.6 F (37 C), temperature source Oral, resp. rate 19, height 5\' 7"  (1.702 m), weight 63.5 kg, SpO2 100 %. Body mass index is 21.93 kg/m.  Treatment Plan Summary: Daily contact with patient to assess and evaluate symptoms and progress in treatment and Medication management -TOC consult placed for additional outpatient resources  Disposition: No evidence of imminent risk to self or others at present.   Patient does not meet criteria for psychiatric inpatient admission. Supportive therapy provided about ongoing stressors. Refer to IOP. Discussed crisis plan, support from social network, calling 911, coming to the Emergency Department, and calling Suicide Hotline.  , NP 12/14/2022 11:38 AM

## 2022-12-14 NOTE — ED Provider Notes (Signed)
[facility] EMERGENCY DEPARTMENT Provider Note   CSN: [id number] Arrival date & time: 12/13/22  2010     History  No chief complaint on file.   Julian Pugh is a 37 y.o. male.  Patient presents to the emergency department today for evaluation of ongoing ear pain, facial pain and hearing loss.  Patient states that he developed ear pain on Christmas Day.  This then improved after he put tea tree oil into his ears but had persistent hearing loss which has gradually improved.  Patient followed up with urgent care on 12/26 and was prescribed an antibiotic of which she does not remember the name.  He also followed up with ENT 1/3 and had a hearing test.  Patient return to urgent care yesterday for left-sided tooth pain and continued hearing loss.  He was prescribed a course of Augmentin, Flonase, Xyzal, prednisone and was encouraged to follow-up with ENT.  Patient states he was scheduled for an appointment at the end of February.   He also notes increasing stressors with his mental health since his ear pain started around Christmas.  He states that he has felt homicidal and suicidal at times.  He denies overt plan but is afraid at times that he might snap and hurt family members or bystanders.  He states that he is able to calm himself down because he thinks he would get charged from hurting someone else, but then also thinks that maybe will be better for him just to be in jail.  He voices extreme frustration at the lack of ability to get prompt healthcare, citing his ENT appointment and wait times in the emergency room.  He feels that he is being taken advantage of.  He would like to talk to someone about his mental health.       Home Medications Prior to Admission medications   Medication Sig Start Date End Date Taking? Authorizing Provider  fluticasone (FLONASE) 50 MCG/ACT nasal spray Place 2 sprays into both nostrils daily. 12/13/22  Yes Jaynee Eagles, PA-C   amoxicillin-clavulanate (AUGMENTIN) 875-125 MG tablet Take 1 tablet by mouth 2 (two) times daily. Patient not taking: Reported on 12/13/2022 12/13/22   Jaynee Eagles, PA-C  famotidine (PEPCID) 20 MG tablet Take 1 tablet (20 mg total) by mouth 2 (two) times daily. Patient not taking: Reported on 12/13/2022 02/21/22   Camillia Herter, NP  gabapentin (NEURONTIN) 300 MG capsule Take 300 mg by mouth 3 (three) times daily. Patient not taking: Reported on 04/10/2022 11/16/20   [provider]  ibuprofen (ADVIL) 800 MG tablet Take 1 tablet (800 mg total) by mouth 3 (three) times daily. Patient not taking: Reported on 12/13/2022 04/10/22   Raspet, Derry Skill, PA-C  levocetirizine (XYZAL) 5 MG tablet Take 1 tablet (5 mg total) by mouth every evening. Patient not taking: Reported on 12/13/2022 12/13/22   Jaynee Eagles, PA-C  predniSONE (DELTASONE) 50 MG tablet Take 1 tablet (50 mg total) by mouth daily with breakfast. Patient not taking: Reported on 12/13/2022 12/13/22   Jaynee Eagles, PA-C  pseudoephedrine (SUDAFED) 60 MG tablet Take 1 tablet (60 mg total) by mouth every 8 (eight) hours as needed for congestion. Patient not taking: Reported on 12/13/2022 12/13/22   Jaynee Eagles, PA-C  tamsulosin (FLOMAX) 0.4 MG CAPS capsule Take 0.4 mg by mouth daily. Patient not taking: Reported on 12/13/2022 10/05/20   [provider]  tiZANidine (ZANAFLEX) 4 MG tablet Take 1 tablet (4 mg total) by mouth at  bedtime. Patient not taking: Reported on 12/13/2022 12/13/22   Jaynee Eagles, PA-C  triamcinolone cream (KENALOG) 0.1 % Apply 1 application topically 2 (two) times daily. Patient not taking: Reported on 12/13/2022 08/16/20   Vanessa Kick, MD      Allergies    Patient has no known allergies.    Review of Systems   Review of Systems  Physical Exam Updated Vital Signs BP 103/81 (BP Location: Right Arm)   Pulse 71   Temp 98.6 F (37 C) (Oral)   Resp 19   Ht 5\' 7"  (1.702 m)   Wt 63.5 kg   SpO2 98%   BMI 21.93 kg/m    Physical Exam Vitals and nursing note reviewed.  Constitutional:      General: He is not in acute distress.    Appearance: He is well-developed.  HENT:     Head: Normocephalic and atraumatic.     Right Ear: No decreased hearing noted. No drainage, swelling or tenderness. No middle ear effusion. Tympanic membrane is retracted. Tympanic membrane is not perforated or erythematous.     Left Ear: Decreased hearing noted. No drainage, swelling or tenderness. A middle ear effusion is present. Tympanic membrane is not perforated, erythematous or retracted.     Ears:     Comments: No significant mastoid tenderness to palpation, or overlying erythema or redness. Eyes:     General:        Right eye: No discharge.        Left eye: No discharge.     Conjunctiva/sclera: Conjunctivae normal.  Cardiovascular:     Rate and Rhythm: Normal rate and regular rhythm.     Heart sounds: Normal heart sounds.  Pulmonary:     Effort: Pulmonary effort is normal.     Breath sounds: Normal breath sounds.  Abdominal:     Palpations: Abdomen is soft.     Tenderness: There is no abdominal tenderness.  Musculoskeletal:     Cervical back: Normal range of motion and neck supple.  Skin:    General: Skin is warm and dry.  Neurological:     Mental Status: He is alert.  Psychiatric:        Attention and Perception: Attention normal.        Mood and Affect: Affect is labile and angry.        Speech: Speech normal.        Behavior: Behavior is agitated.        Thought Content: Thought content is not paranoid. Thought content includes homicidal and suicidal ideation. Thought content does not include homicidal or suicidal plan.        Judgment: Judgment is impulsive.     ED Results / Procedures / Treatments   Labs (all labs ordered are listed, but only abnormal results are displayed) Labs Reviewed  CBC - Abnormal; Notable for the following components:      Result Value   RBC 3.89 (*)    Hemoglobin 11.8 (*)     HCT 36.4 (*)    All other components within normal limits  BASIC METABOLIC PANEL - Abnormal; Notable for the following components:   BUN 5 (*)    Calcium 8.8 (*)    All other components within normal limits    EKG None  Radiology CT TEMPORAL BONES WO CONTRAST  Result Date: 12/13/2022 CLINICAL DATA:  Initial evaluation for hearing loss for 2 weeks. EXAM: CT TEMPORAL BONES WITHOUT CONTRAST TECHNIQUE: Axial and coronal plane CT imaging of  the petrous temporal bones was performed with thin-collimation image reconstruction. No intravenous contrast was administered. Multiplanar CT image reconstructions were also generated. RADIATION DOSE REDUCTION: This exam was performed according to the departmental dose-optimization program which includes automated exposure control, adjustment of the mA and/or kV according to patient size and/or use of iterative reconstruction technique. COMPARISON:  None Available. FINDINGS: RIGHT TEMPORAL BONE External auditory canal: Right EAC is clear. Tympanic membrane thin and intact. Middle ear cavity: Middle ear cavity is clear and well pneumatized. Ossicular chain intact. Tegmen tympani intact. No abnormality about Prussak's space or scutum. Inner ear structures: Inner ear structures including the vestibule, cochlea, and semi circular canals within normal limits. Internal auditory and facial nerve canals: Internal auditory canal within normal limits. No visible CP angle mass or other structural abnormality. Normal vestibular aqueduct. Facial nerve canal intact and bony covered to the stylomastoid foramen. Mastoid air cells: Trace right mastoid effusion. No coalescence or erosive changes. Sigmoid plate intact. LEFT TEMPORAL BONE External auditory canal: Minimal soft tissue density within the left EAC, likely cerumen. Left tympanic membrane is minimally thickened superiorly but grossly intact. Middle ear cavity: Middle ear cavity clear. Ossicular chain intact. Tegmen tympani  intact. Inner ear structures: Inner ear structures including the vestibule, semi circular canals, and cochlea within normal limits. Internal auditory and facial nerve canals: Internal auditory canal within normal limits. No visible mass or other structural abnormality at the left CP angle cistern. Normal vestibular aqueduct. Facial nerve canal intact and bony covered to the stylomastoid foramen. Mastoid air cells: Moderate left mastoid effusion. No coalescence or erosive changes. Sigmoid plate intact. Vascular: Carotid canals and jugular bulbs intact and within normal limits. Limited intracranial: Visualized intracranial contents within normal limits. Visible orbits/paranasal sinuses: Visualized paranasal sinuses are largely clear. Orbital soft tissues not visualized on this exam. Soft tissues: No pre or postauricular soft tissue swelling. No collections. IMPRESSION: 1. Moderate left and trace right mastoid effusions. Correlation with physical exam for possible acute mastoiditis recommended. No coalescence or other complicating features. 2. Otherwise unremarkable CT of the temporal bones. No other acute abnormality identified. Electronically Signed   By: Rise Mu M.D.   On: 12/13/2022 23:22   DG Chest 2 View  Result Date: 12/13/2022 CLINICAL DATA:  Hearing loss EXAM: CHEST - 2 VIEW COMPARISON:  None Available. FINDINGS: The heart size and mediastinal contours are within normal limits. Both lungs are clear. The visualized skeletal structures are unremarkable. IMPRESSION: No active cardiopulmonary disease. Electronically Signed   By: Alcide Clever M.D.   On: 12/13/2022 22:34    Procedures Procedures    Medications Ordered in ED Medications  oxyCODONE-acetaminophen (PERCOCET/ROXICET) 5-325 MG per tablet 1 tablet (1 tablet Oral Given 12/14/22 0146)  oxyCODONE-acetaminophen (PERCOCET/ROXICET) 5-325 MG per tablet 1 tablet (1 tablet Oral Given 12/14/22 1062)    ED Course/ Medical Decision Making/  A&P    Patient seen and examined. History obtained directly from patient. Work-up including labs, imaging, EKG ordered in triage, if performed, were reviewed.    Labs/EKG: Independently reviewed and interpreted.  This included: CBC with normal white blood cell count, mild anemia at 11.8 otherwise unremarkable; BMP unremarkable.  Imaging: Independently visualized and interpreted.  This included: Chest x-ray and CT temporal bones, agree no acute findings on chest x-ray and CT with possible mild mastoiditis.  Medications/Fluids: Ordered: P.o. Percocet for pain   Most recent vital signs reviewed and are as follows: BP 103/81 (BP Location: Right Arm)   Pulse 71  Temp 98.6 F (37 C) (Oral)   Resp 19   Ht 5\' 7"  (1.702 m)   Wt 63.5 kg   SpO2 98%   BMI 21.93 kg/m   Initial impression:   In regards to patient's hearing loss, most likely conductive.  I agree with current treatments including Augmentin, antihistamines, Flonase.  I attempted to discuss the medical rationale for treatment and discussed that usually it just takes time for this to improve no matter what we do.  I do think he should follow-up with ENT when his appointment is to make sure things clear up.  No further workup or treatment for this at this time.  More pressing, patient is obviously struggling with underlying stressors.  He seems to have good insight into his feelings.  After discussion, feel that he would benefit from TTS evaluation for further recommendations.  Patient is in agreement.  I do not feel that he meets criteria for IVC at this time.  11:49 AM Reassessment performed. Patient appears stable.  TTS consult complete.  Patient refuses voluntary admission and does not meet criteria for involuntary treatment.  Patient given mental health referrals.  Plan: Discharge to home.   Prescriptions written for: No new prescriptions.  Patient was prescribed appropriate treatment at urgent care yesterday and have encouraged  him to fill these and take these.  ED return instructions discussed: With worsening or changing symptoms  Follow-up instructions discussed: Patient encouraged to follow-up with their PCP and ENT as planned.   Click here for ABCD2, HEART and other calculatorsREFRESH Note before signing :1}                          Medical Decision Making Risk Prescription drug management.   Ear pain: CT imaging today shows possible mild mastoiditis.  No otitis media or externa on exam.  Patient does have evidence of eustachian tube dysfunction.  Agree with antihistamines and trial of Augmentin.  Agree with ENT follow-up.  Patient also with stressors and vague SI/HI: Evaluated and assessed by behavioral health.  Patient declines admission.  No indications for IVC.  Follow-up as outpatient encouraged.  The patient's vital signs, pertinent lab work and imaging were reviewed and interpreted as discussed in the ED course. Hospitalization was considered for further testing, treatments, or serial exams/observation. However as patient is well-appearing, has a stable exam, and reassuring studies today, I do not feel that they warrant admission at this time. This plan was discussed with the patient who verbalizes agreement and comfort with this plan and seems reliable and able to return to the Emergency Department with worsening or changing symptoms.          Final Clinical Impression(s) / ED Diagnoses Final diagnoses:  Left ear pain  Hearing loss of left ear, unspecified hearing loss type  Stress    Rx / DC Orders ED Discharge Orders     None         Carlisle Cater, Hershal Coria 12/14/22 1151    Fransico Meadow, MD 12/14/22 1900

## 2022-12-14 NOTE — ED Notes (Signed)
The pt refuses  to take his percocet unless he has bottled water  he will not give the  percocet back  with no bottled water here

## 2022-12-14 NOTE — Care Management (Signed)
Resources added to AVS.

## 2022-12-14 NOTE — Discharge Instructions (Addendum)
Please take the medications that were prescribed by urgent care yesterday and follow-up with ENT again at the time of your next appointment to make sure that your symptoms are improving or to determine if you need additional treatment or evaluation.  Return to the emergency department with any worsening or changing symptoms.                   Intensive Outpatient Programs   [facility]                                  [facility] [address]                                                       [address] [place],  [place]                                                           [place], [place] [phone]                                                              [phone]   [facility]                [facility]         (Inpatient and outpatient)                    [phone] (Suboxone and Methadone) [address]                                                                                                                 [phone]                                                                                                     [facility]: [facility]  [facility] - Intensive Outpatient [place] [address] [place], [place] [address]                                                 [place], [place]                                                              [phone]   [facility] (Outpatient, Inpatient, Chemical       [facility] (Groups and [facility]) (insurance only) [phone]                                              [place], [place]                                                    [facility]                                       [facility] (for caregivers and family) [address]                                                    [address], [place], [place]  [phone]   Residential Treatment Programs   [place]          [facility](2 years) Residential: 90 days)                 [facility] ([place], [place], [place]                                                              [phone] or [phone]   [facility]                                              [facility] [address]                                                               [address] [place], [place], [place]                                                              [phone]   [place]                                [facility] ([facility]) [place]                                                           [address] [place], [place] [address]  [place], [place]                                                              [phone] Admissions: 8am-3pm M-F   [facility]: Residential Program [phone] Days)                     [facility]: [facility]  [place], [place], [place] or [phone]                                             (Walk in Hours over the weekend or by referral)

## 2022-12-25 DIAGNOSIS — H6983 Other specified disorders of Eustachian tube, bilateral: Secondary | ICD-10-CM | POA: Diagnosis not present

## 2022-12-25 DIAGNOSIS — H906 Mixed conductive and sensorineural hearing loss, bilateral: Secondary | ICD-10-CM | POA: Diagnosis not present

## 2023-02-27 DIAGNOSIS — H918X3 Other specified hearing loss, bilateral: Secondary | ICD-10-CM | POA: Diagnosis not present

## 2023-02-27 DIAGNOSIS — H903 Sensorineural hearing loss, bilateral: Secondary | ICD-10-CM | POA: Diagnosis not present

## 2023-03-28 ENCOUNTER — Ambulatory Visit
Admission: RE | Admit: 2023-03-28 | Discharge: 2023-03-28 | Disposition: A | Discharge status: Home / Self Care | Payer: 59 | Source: Ambulatory Visit | Attending: Family Medicine | Admitting: Family Medicine

## 2023-03-28 VITALS — BP 121/81 | HR 81 | Temp 98.6°F | Resp 18

## 2023-03-28 DIAGNOSIS — M5412 Radiculopathy, cervical region: Secondary | ICD-10-CM

## 2023-03-28 MED ORDER — PREDNISONE 20 MG PO TABS: 40.0000 mg | ORAL_TABLET | Freq: Every day | ORAL | 0 refills | Status: AC

## 2023-03-28 MED ORDER — DICLOFENAC SODIUM 75 MG PO TBEC: 75.0000 mg | DELAYED_RELEASE_TABLET | Freq: Two times a day (BID) | ORAL | 0 refills | Status: DC | PRN

## 2023-03-28 NOTE — ED Triage Notes (Signed)
Pt presents with ongoing arm pain that radiates down into hand that causes numbness & tingling X 2 weeks.

## 2023-03-28 NOTE — Discharge Instructions (Signed)
Take prednisone 20 mg--2 daily for 5 days   When done with the prednisone, start taking diclofenac 75 mg--1 tablet 2 times daily as needed for pain   You can use the QR code/website at the back of the summary paperwork to schedule yourself a new patient appointment with primary care

## 2023-03-28 NOTE — ED Provider Notes (Signed)
[facility]    CSN: [id number] Arrival date & time: 03/28/23  1656      History   Chief Complaint Chief Complaint  Patient presents with   Arm Injury    Left arm and hand is tingling and numbness that last for hours everyday after waking up - Entered by patient    HPI Julian Pugh is a 37 y.o. male.    Arm Injury  Here for tingling in his left arm and hand that began about 2 weeks ago.  No new trauma.  He has not really noticed that his neck hurts.  He does have chronic low back pain with some disc disease in his lumbar spine.  He has seen specialist for this and had eyes.  No fever or rash  Past Medical History:  Diagnosis Date   Bipolar 1 disorder (HCC)    Depression    Eczema    Hypertension     Patient Active Problem List   Diagnosis Date Noted   MDD (major depressive disorder), recurrent episode, moderate (HCC) 12/14/2022   Chronic pain 02/07/2022   Displacement of lumbar intervertebral disc without myelopathy 02/07/2022   Sacrococcygeal disorders, not elsewhere classified 02/07/2022   Sciatica 02/07/2022   Arthropathy of lumbar facet joint 10/02/2021   Depression 12/28/2019   Post-traumatic stress disorder, unspecified 12/28/2019   Nicotine dependence, unspecified, uncomplicated 12/28/2019   Intermittent explosive disorder 12/28/2019   Antisocial personality disorder (HCC) 12/28/2019   Cannabis dependence in remission (HCC) 06/11/2019   Atopic eczema 10/30/2011    History reviewed. No pertinent surgical history.     Home Medications    Prior to Admission medications   Medication Sig Start Date End Date Taking? Authorizing Provider  diclofenac (VOLTAREN) 75 MG EC tablet Take 1 tablet (75 mg total) by mouth 2 (two) times daily as needed (pain). Start taking when you are done with the prednisone 03/28/23  Yes Pierre Cumpton, Janace Aris, MD  predniSONE (DELTASONE) 20 MG tablet Take 2 tablets (40 mg total) by mouth daily with breakfast for 5  days. 03/28/23 04/02/23 Yes Zenia Resides, MD  fluticasone (FLONASE) 50 MCG/ACT nasal spray Place 2 sprays into both nostrils daily. 12/13/22   Wallis Bamberg, PA-C  levocetirizine (XYZAL) 5 MG tablet Take 1 tablet (5 mg total) by mouth every evening. Patient not taking: Reported on 12/13/2022 12/13/22   Wallis Bamberg, PA-C    Family History Family History  Problem Relation Age of Onset   Healthy Mother    Healthy Father     Social History Social History   Tobacco Use   Smoking status: Every Day    Packs/day: 0.50    Years: 11.00    Additional pack years: 0.00    Total pack years: 5.50    Types: Cigarettes   Smokeless tobacco: Never  Vaping Use   Vaping Use: Never used  Substance Use Topics   Alcohol use: Yes   Drug use: Yes    Types: Marijuana     Allergies   Patient has no known allergies.   Review of Systems Review of Systems   Physical Exam Triage Vital Signs ED Triage Vitals [03/28/23 1724]  Enc Vitals Group     BP 121/81     Pulse Rate 81     Resp 18     Temp 98.6 F (37 C)     Temp Source Oral     SpO2 96 %     Weight  Height      Head Circumference      Peak Flow      Pain Score      Pain Loc      Pain Edu?      Excl. in GC?    No data found.  Updated Vital Signs BP 121/81 (BP Location: Left Arm)   Pulse 81   Temp 98.6 F (37 C) (Oral)   Resp 18   SpO2 96%   Visual Acuity Right Eye Distance:   Left Eye Distance:   Bilateral Distance:    Right Eye Near:   Left Eye Near:    Bilateral Near:     Physical Exam Vitals reviewed.  Constitutional:      General: He is not in acute distress.    Appearance: He is not ill-appearing, toxic-appearing or diaphoretic.  HENT:     Nose: Nose normal.     Mouth/Throat:     Mouth: Mucous membranes are moist.  Eyes:     Extraocular Movements: Extraocular movements intact.     Conjunctiva/sclera: Conjunctivae normal.     Pupils: Pupils are equal, round, and reactive to light.  Cardiovascular:      Rate and Rhythm: Normal rate and regular rhythm.     Heart sounds: No murmur heard. Pulmonary:     Effort: Pulmonary effort is normal.     Breath sounds: Normal breath sounds.  Musculoskeletal:     Cervical back: Neck supple.  Skin:    Coloration: Skin is not jaundiced or pale.  Neurological:     Mental Status: He is alert.      UC Treatments / Results  Labs (all labs ordered are listed, but only abnormal results are displayed) Labs Reviewed - No data to display  EKG   Radiology No results found.  Procedures Procedures (including critical care time)  Medications Ordered in UC Medications - No data to display  Initial Impression / Assessment and Plan / UC Course  I have reviewed the triage vital signs and the nursing notes.  Pertinent labs & imaging results that were available during my care of the patient were reviewed by me and considered in my medical decision making (see chart for details).        I discussed with him that I do think this sounds like some cervical radiculopathy, similar to the lumbar disc disease problems he is having.  Short burst of prednisone is sent in, as he remembers having some trouble taking gabapentin in the past.  When he is done with prednisone he can take diclofenac as needed.  We discussed getting in with primary care and he wants to change where he goes for primary care.  Instructions are given on how to self schedule on the [facility] health website. Final Clinical Impressions(s) / UC Diagnoses   Final diagnoses:  Cervical radiculopathy     Discharge Instructions      Take prednisone 20 mg--2 daily for 5 days   When done with the prednisone, start taking diclofenac 75 mg--1 tablet 2 times daily as needed for pain   You can use the QR code/website at the back of the summary paperwork to schedule yourself a new patient appointment with primary care       ED Prescriptions     Medication Sig Dispense Auth. Provider    predniSONE (DELTASONE) 20 MG tablet Take 2 tablets (40 mg total) by mouth daily with breakfast for 5 days. 10 tablet Zenia Resides, MD  diclofenac (VOLTAREN) 75 MG EC tablet Take 1 tablet (75 mg total) by mouth 2 (two) times daily as needed (pain). Start taking when you are done with the prednisone 20 tablet Berlyn Malina, Janace Aris, MD      PDMP not reviewed this encounter.   Zenia Resides, MD 03/28/23 [phone]

## 2023-04-08 DIAGNOSIS — H903 Sensorineural hearing loss, bilateral: Secondary | ICD-10-CM | POA: Diagnosis not present

## 2023-07-01 ENCOUNTER — Ambulatory Visit: Payer: 59 | Admitting: Gastroenterology

## 2023-07-01 DIAGNOSIS — M533 Sacrococcygeal disorders, not elsewhere classified: Secondary | ICD-10-CM | POA: Insufficient documentation

## 2023-09-15 DIAGNOSIS — S0003XA Contusion of scalp, initial encounter: Secondary | ICD-10-CM | POA: Diagnosis not present

## 2023-09-15 DIAGNOSIS — S0001XA Abrasion of scalp, initial encounter: Secondary | ICD-10-CM | POA: Diagnosis not present

## 2023-11-21 ENCOUNTER — Encounter (HOSPITAL_COMMUNITY): Payer: Self-pay

## 2023-11-21 ENCOUNTER — Ambulatory Visit (HOSPITAL_COMMUNITY)
Admission: RE | Admit: 2023-11-21 | Discharge: 2023-11-21 | Disposition: A | Discharge status: Home / Self Care | Payer: 59 | Source: Ambulatory Visit | Attending: Neurology | Admitting: Neurology

## 2023-11-21 VITALS — BP 131/93 | HR 75 | Temp 97.8°F | Resp 16 | Ht 67.0 in | Wt 144.0 lb

## 2023-11-21 DIAGNOSIS — K047 Periapical abscess without sinus: Secondary | ICD-10-CM | POA: Diagnosis not present

## 2023-11-21 DIAGNOSIS — K089 Disorder of teeth and supporting structures, unspecified: Secondary | ICD-10-CM | POA: Diagnosis not present

## 2023-11-21 DIAGNOSIS — G8929 Other chronic pain: Secondary | ICD-10-CM | POA: Diagnosis not present

## 2023-11-21 MED ORDER — KETOROLAC TROMETHAMINE 30 MG/ML IJ SOLN
30.0000 mg | Freq: Once | INTRAMUSCULAR | Status: AC
  Administered 2023-11-21: 30 mg via INTRAMUSCULAR

## 2023-11-21 MED ORDER — KETOROLAC TROMETHAMINE 30 MG/ML IJ SOLN
INTRAMUSCULAR | Status: AC
  Filled 2023-11-21: qty 1

## 2023-11-21 MED ORDER — CHLORHEXIDINE GLUCONATE 0.12 % MT SOLN: 15.0000 mL | Freq: Two times a day (BID) | OROMUCOSAL | 0 refills | Status: AC

## 2023-11-21 MED ORDER — AMOXICILLIN-POT CLAVULANATE 875-125 MG PO TABS: 1.0000 | ORAL_TABLET | Freq: Two times a day (BID) | ORAL | 0 refills | Status: AC

## 2023-11-21 NOTE — Discharge Instructions (Addendum)
Your dental pain is likely due to dental infection. Take antibiotic as prescribed for the next 7 days to treat your dental infection. Continue use of ibuprofen/tylenol as needed with food for dental inflammation and pain.  Do not take ibuprofen or NSAIDS for 48 hours as you were given a strong NSAID in the clinic today.  Perform salt water gargles every 3-4 hours. May use prescribed Peridex as well.  Please attend your scheduled appointment on Jan 30. We will also provide a list of dentists incase you need a different appointment.  If you develop any new or worsening symptoms or if your symptoms do not start to improve, pleases return here or follow-up with your primary care provider. If your symptoms are severe including severe swelling, fever, shortness of breath or leg swelling, please go to the emergency room.

## 2023-11-21 NOTE — ED Triage Notes (Signed)
Patient here today with c/o left side dental pain X 3 days. He has been taking IBU and Tylenol with some relief. He has also been using warm salt water gargles.

## 2023-11-21 NOTE — ED Provider Notes (Signed)
[facility]    CSN: [id number] Arrival date & time: 11/21/23  4098      History   Chief Complaint Chief Complaint  Patient presents with   Dental Problem    Seeking pain and antibiotics medicine to hold me over until my dentist appointment - Entered by patient    HPI Julian Pugh is a 37 y.o. male.    Reports continued dental pain and infection. Has an appointment with [facility] [address] on January 30th. This has been an ongoing issue. Years ago he pulled his teeth and now he has parts of them falling out he also states that he has swallowed parts of his teeth while eating as they are falling out in pieces.  He has taken Augmentin in the past for his dental abscesses and has tolerated this well.  He noticed the swelling started on the left side of his face yesterday.  He has relatively constant pain in his face and mouth.  He has been afebrile.  Denies swelling in his legs or difficulty breathing.  He is hoping to get antibiotics and pain medicine to hold him over until his dentist appointment in January.  The history is provided by the patient.    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   Sensorineural hearing loss (SNHL) of both ears 02/27/2023   MDD (major depressive disorder), recurrent episode, moderate (HCC) 12/14/2022   Chronic pain 02/07/2022   Displacement of lumbar intervertebral disc without myelopathy 02/07/2022   Sacrococcygeal disorders, not elsewhere classified 02/07/2022   Sciatica 02/07/2022   Arthropathy of lumbar facet joint 10/02/2021   Depression 12/28/2019   Post-traumatic stress disorder, unspecified 12/28/2019   Nicotine dependence, unspecified, uncomplicated 12/28/2019   Intermittent explosive disorder 12/28/2019    Antisocial personality disorder (HCC) 12/28/2019   Cannabis dependence in remission (HCC) 06/11/2019   Atopic eczema 10/30/2011    History reviewed. No pertinent surgical history.     Home Medications    Prior to Admission medications   Medication Sig Start Date End Date Taking? Authorizing Provider  amoxicillin-clavulanate (AUGMENTIN) 875-125 MG tablet Take 1 tablet by mouth every 12 (twelve) hours. 11/21/23  Yes Lavonn Maxcy, Ludger Nutting, NP  chlorhexidine (PERIDEX) 0.12 % solution Use as directed 15 mLs in the mouth or throat 2 (two) times daily. 11/21/23  Yes Elmer Picker, NP    Family History Family History  Problem Relation Age of Onset   Healthy Mother    Healthy Father     Social History Social History   Tobacco Use   Smoking status: Every Day    Current packs/day: 0.50    Average packs/day: 0.5 packs/day for 11.0 years (5.5 ttl pk-yrs)    Types: Cigarettes   Smokeless tobacco: Never  Vaping Use   Vaping status: Never Used  Substance Use Topics   Alcohol use: Not Currently   Drug use: Not Currently    Types: Marijuana     Allergies   Patient has no known allergies.   Review of Systems Review of Systems   Physical Exam Triage Vital Signs ED Triage Vitals  Encounter Vitals Group     BP 11/21/23 1024 (!) 131/93     Systolic BP Percentile --  Diastolic BP Percentile --      Pulse Rate 11/21/23 1024 75     Resp 11/21/23 1024 16     Temp 11/21/23 1024 97.8 F (36.6 C)     Temp Source 11/21/23 1024 Oral     SpO2 11/21/23 1024 96 %     Weight 11/21/23 1023 144 lb (65.3 kg)     Height 11/21/23 1023 5\' 7"  (1.702 m)     Head Circumference --      Peak Flow --      Pain Score 11/21/23 1021 10     Pain Loc --      Pain Education --      Exclude from Growth Chart --    No data found.  Updated Vital Signs BP (!) 131/93 (BP Location: Left Arm)   Pulse 75   Temp 97.8 F (36.6 C) (Oral)   Resp 16   Ht 5\' 7"  (1.702 m)   Wt 144 lb (65.3 kg)   SpO2 96%    BMI 22.55 kg/m   Visual Acuity Right Eye Distance:   Left Eye Distance:   Bilateral Distance:    Right Eye Near:   Left Eye Near:    Bilateral Near:     Physical Exam HENT:     Mouth/Throat:     Mouth: Mucous membranes are moist.     Dentition: Abnormal dentition. Dental tenderness, gingival swelling and dental abscesses present.     Comments: Multiple areas of broken teeth with swelling as documented with photo in the media tab.        UC Treatments / Results  Labs (all labs ordered are listed, but only abnormal results are displayed) Labs Reviewed - No data to display  EKG   Radiology No results found.  Procedures Procedures (including critical care time)  Medications Ordered in UC Medications  ketorolac (TORADOL) 30 MG/ML injection 30 mg (30 mg Intramuscular Given 11/21/23 1050)    Initial Impression / Assessment and Plan / UC Course  I have reviewed the triage vital signs and the nursing notes.  Pertinent labs & imaging results that were available during my care of the patient were reviewed by me and considered in my medical decision making (see chart for details). Discussed dental history with patient.  He does have a dentist appointment on January 30 which she is planning on attending and hoping to get the rest damaged teeth pulled.  In the meantime he has been prescribed antibiotics to help with his infection and pain.  He received a Toradol shot in clinic and has been educated to not use NSAIDs for the next 48 hours.  He will continue to use salt gargles and Peridex solution.  Photos of teeth added to chart.         Final Clinical Impressions(s) / UC Diagnoses   Final diagnoses:  Dental abscess     Discharge Instructions      Your dental pain is likely due to dental infection. Take antibiotic as prescribed for the next 7 days to treat your dental infection. Continue use of ibuprofen/tylenol as needed with food for dental inflammation and  pain.  Do not take ibuprofen or NSAIDS for 48 hours as you were given a strong NSAID in the clinic today.  Perform salt water gargles every 3-4 hours. May use prescribed Peridex as well.  Please attend your scheduled appointment on Jan 30. We will also provide a list of dentists incase you need a different appointment.  If you develop any new or worsening symptoms or if your symptoms do not start to improve, pleases return here or follow-up with your primary care provider. If your symptoms are severe including severe swelling, fever, shortness of breath or leg swelling, please go to the emergency room.     ED Prescriptions     Medication Sig Dispense Auth. Provider   chlorhexidine (PERIDEX) 0.12 % solution Use as directed 15 mLs in the mouth or throat 2 (two) times daily. 120 mL Elmer Picker, NP   amoxicillin-clavulanate (AUGMENTIN) 875-125 MG tablet Take 1 tablet by mouth every 12 (twelve) hours. 14 tablet Elmer Picker, NP      PDMP not reviewed this encounter.   Elmer Picker, NP 11/21/23 1141
